# Patient Record
Sex: Female | Born: 1975 | Race: White | Hispanic: Yes | Marital: Married | Smoking: Never smoker
Health system: Southern US, Community
[De-identification: ages and names within clinical notes are randomized; demographics above are authoritative.]

## PROBLEM LIST (undated history)

## (undated) DIAGNOSIS — N979 Female infertility, unspecified: Secondary | ICD-10-CM

## (undated) DIAGNOSIS — K219 Gastro-esophageal reflux disease without esophagitis: Secondary | ICD-10-CM

## (undated) HISTORY — DX: Female infertility, unspecified: N97.9

## (undated) HISTORY — PX: OTHER SURGICAL HISTORY: SHX169

## (undated) HISTORY — DX: Gastro-esophageal reflux disease without esophagitis: K21.9

---

## 2006-01-27 ENCOUNTER — Inpatient Hospital Stay (HOSPITAL_COMMUNITY): Admission: AD | Admit: 2006-01-27 | Discharge: 2006-01-27 | Payer: Self-pay | Admitting: Gynecology

## 2007-11-05 ENCOUNTER — Ambulatory Visit: Payer: Self-pay | Admitting: Internal Medicine

## 2012-12-15 DIAGNOSIS — Z3400 Encounter for supervision of normal first pregnancy, unspecified trimester: Secondary | ICD-10-CM | POA: Insufficient documentation

## 2012-12-15 DIAGNOSIS — O09519 Supervision of elderly primigravida, unspecified trimester: Secondary | ICD-10-CM | POA: Insufficient documentation

## 2012-12-15 DIAGNOSIS — Z331 Pregnant state, incidental: Secondary | ICD-10-CM | POA: Insufficient documentation

## 2013-07-26 ENCOUNTER — Inpatient Hospital Stay: Payer: Self-pay

## 2013-07-27 LAB — CBC WITH DIFFERENTIAL/PLATELET
BASOS ABS: 0 10*3/uL (ref 0.0–0.1)
BASOS PCT: 0.4 %
BASOS PCT: 0.6 %
Basophil #: 0.1 10*3/uL (ref 0.0–0.1)
EOS ABS: 0.1 10*3/uL (ref 0.0–0.7)
EOS PCT: 0.7 %
EOS PCT: 0.4 %
Eosinophil #: 0.1 10*3/uL (ref 0.0–0.7)
HCT: 36.3 % (ref 35.0–47.0)
HCT: 38.2 % (ref 35.0–47.0)
HGB: 12.6 g/dL (ref 12.0–16.0)
HGB: 13.1 g/dL (ref 12.0–16.0)
LYMPHS PCT: 21.6 %
LYMPHS PCT: 19 %
Lymphocyte #: 2.2 10*3/uL (ref 1.0–3.6)
Lymphocyte #: 2.4 10*3/uL (ref 1.0–3.6)
MCH: 31.6 pg (ref 26.0–34.0)
MCH: 30.6 pg (ref 26.0–34.0)
MCHC: 34.8 g/dL (ref 32.0–36.0)
MCHC: 34.2 g/dL (ref 32.0–36.0)
MCV: 91 fL (ref 80–100)
MCV: 90 fL (ref 80–100)
Monocyte #: 0.6 x10 3/mm (ref 0.2–0.9)
Monocyte #: 0.8 x10 3/mm (ref 0.2–0.9)
Monocyte %: 6.4 %
Monocyte %: 6.2 %
NEUTROS ABS: 7.1 10*3/uL — AB (ref 1.4–6.5)
Neutrophil #: 9.1 10*3/uL — ABNORMAL HIGH (ref 1.4–6.5)
Neutrophil %: 70.9 %
Neutrophil %: 73.8 %
PLATELETS: 122 10*3/uL — AB (ref 150–440)
Platelet: 136 10*3/uL — ABNORMAL LOW (ref 150–440)
RBC: 4 10*6/uL (ref 3.80–5.20)
RBC: 4.27 10*6/uL (ref 3.80–5.20)
RDW: 13.7 % (ref 11.5–14.5)
RDW: 13.3 % (ref 11.5–14.5)
WBC: 10.1 10*3/uL (ref 3.6–11.0)
WBC: 12.4 10*3/uL — ABNORMAL HIGH (ref 3.6–11.0)

## 2013-07-29 LAB — HEMATOCRIT: HCT: 28.8 % — ABNORMAL LOW (ref 35.0–47.0)

## 2015-03-16 ENCOUNTER — Encounter: Payer: Self-pay | Admitting: Urology

## 2015-03-16 ENCOUNTER — Ambulatory Visit: Payer: 59 | Admitting: Urology

## 2015-03-16 VITALS — BP 99/66 | HR 76 | Ht 64.0 in | Wt 133.5 lb

## 2015-03-16 LAB — MICROSCOPIC EXAMINATION: Bacteria, UA: NONE SEEN

## 2015-03-16 LAB — URINALYSIS, COMPLETE
Bilirubin, UA: NEGATIVE
GLUCOSE, UA: NEGATIVE
Ketones, UA: NEGATIVE
Nitrite, UA: NEGATIVE
PROTEIN UA: NEGATIVE
Specific Gravity, UA: 1.015 (ref 1.005–1.030)
Urobilinogen, Ur: 1 mg/dL (ref 0.2–1.0)
pH, UA: 7 (ref 5.0–7.5)

## 2015-04-07 ENCOUNTER — Ambulatory Visit: Payer: 59

## 2015-04-12 ENCOUNTER — Ambulatory Visit: Payer: 59

## 2015-05-03 ENCOUNTER — Encounter: Payer: Self-pay | Admitting: Urology

## 2015-05-03 ENCOUNTER — Ambulatory Visit (INDEPENDENT_AMBULATORY_CARE_PROVIDER_SITE_OTHER): Payer: 59 | Admitting: Urology

## 2015-05-03 VITALS — BP 100/60 | HR 74 | Ht 64.0 in | Wt 136.3 lb

## 2015-05-03 DIAGNOSIS — R351 Nocturia: Secondary | ICD-10-CM | POA: Diagnosis not present

## 2015-05-03 DIAGNOSIS — R1084 Generalized abdominal pain: Secondary | ICD-10-CM

## 2015-05-03 DIAGNOSIS — R3129 Other microscopic hematuria: Secondary | ICD-10-CM | POA: Diagnosis not present

## 2015-05-03 DIAGNOSIS — R109 Unspecified abdominal pain: Secondary | ICD-10-CM | POA: Insufficient documentation

## 2015-05-03 LAB — URINALYSIS, COMPLETE
Bilirubin, UA: NEGATIVE
GLUCOSE, UA: NEGATIVE
Ketones, UA: NEGATIVE
LEUKOCYTES UA: NEGATIVE
Nitrite, UA: NEGATIVE
PH UA: 7 (ref 5.0–7.5)
Protein, UA: NEGATIVE
Specific Gravity, UA: 1.015 (ref 1.005–1.030)
Urobilinogen, Ur: 0.2 mg/dL (ref 0.2–1.0)

## 2015-05-03 LAB — MICROSCOPIC EXAMINATION
Epithelial Cells (non renal): NONE SEEN /hpf (ref 0–10)
RENAL EPITHEL UA: NONE SEEN /HPF
WBC, UA: NONE SEEN /hpf (ref 0–?)

## 2015-05-03 MED ORDER — LIDOCAINE HCL 2 % EX GEL
1.0000 "application " | Freq: Once | CUTANEOUS | Status: AC
  Administered 2015-05-03: 1 via URETHRAL

## 2015-05-03 MED ORDER — CIPROFLOXACIN HCL 500 MG PO TABS
500.0000 mg | ORAL_TABLET | Freq: Once | ORAL | Status: AC
  Administered 2015-05-03: 500 mg via ORAL

## 2015-05-03 NOTE — Progress Notes (Signed)
05/03/2015 9:32 AM   Vanessa Cox December 26, 1975 [id number]  Referring provider: Danella PentonMark F Miller, MD [phone] [facility] [place], [place] [id number]  Chief Complaint  Patient presents with  . Hematuria    microscopic hematuria, some sensitivity in the lower abdomen that comes and goes     HPI: The patient is a 39 year old woman who's had microscopic hematuria on 2 or 3 occasions. She's had no gross hematuria. She denies a history kidney stones or previous GU surgery. She's never smoked. She does not take daily aspirin or blood thinners. He gets up once or twice at night and voids every 90 minutes to 2 hours  There is no other modifying factors are associated signs or symptoms. There is no aggravating or relieving factors. The presentation is milder in severity and recurrent.  She's not had a hysterectomy    PMH: Past Medical History  Diagnosis Date  . GERD (gastroesophageal reflux disease)     Surgical History: Past Surgical History  Procedure Laterality Date  . None      Home Medications:    Medication List    Notice  As of 05/03/2015  9:32 AM   You have not been prescribed any medications.      Allergies: No Known Allergies  Family History: Family History  Problem Relation Age of Onset  . Bladder Cancer Neg Hx   . Kidney cancer Neg Hx   . Prostate cancer Neg Hx   . Urinary tract infection Mother   . Nephrolithiasis Brother     Social History:  reports that she has never smoked. She does not have any smokeless tobacco history on file. She reports that she does not drink alcohol or use illicit drugs.  ROS: UROLOGY Frequent Urination?: No Hard to postpone urination?: No Burning/pain with urination?: No Get up at night to urinate?: Yes Leakage of urine?: Yes Urine stream starts and stops?: No Trouble starting stream?: No Do you have to strain to urinate?: No Blood in urine?: Yes Urinary tract infection?: No Sexually  transmitted disease?: No Injury to kidneys or bladder?: No Painful intercourse?: No Weak stream?: No Currently pregnant?: No Vaginal bleeding?: No Last menstrual period?: 2wks ago  Gastrointestinal Nausea?: No Vomiting?: No Indigestion/heartburn?: No Diarrhea?: No Constipation?: No  Constitutional Fever: No Night sweats?: No Weight loss?: No Fatigue?: No  Skin Skin rash/lesions?: No Itching?: No  Eyes Blurred vision?: No Double vision?: No  Ears/Nose/Throat Sore throat?: No Sinus problems?: No  Hematologic/Lymphatic Swollen glands?: No Easy bruising?: No  Cardiovascular Leg swelling?: No Chest pain?: No  Respiratory Cough?: No Shortness of breath?: No  Endocrine Excessive thirst?: Yes  Musculoskeletal Back pain?: Yes Joint pain?: No  Neurological Headaches?: No Dizziness?: No  Psychologic Depression?: No Anxiety?: No  Physical Exam: BP 100/60 mmHg  Pulse 74  Ht 5\' 4"  (1.626 m)  Wt 61.825 kg (136 lb 4.8 oz)  BMI 23.38 kg/m2  Constitutional:  Alert and oriented, No acute distress. HEENT: [place] AT, moist mucus membranes.  Trachea midline, no masses. Cardiovascular: No clubbing, cyanosis, or edema. Respiratory: Normal respiratory effort, no increased work of breathing. GI: Abdomen is soft, nontender, nondistended, no abdominal masses GU: No CVA tenderness. NO prolapse Skin: No rashes, bruises or suspicious lesions. Lymph: No cervical or inguinal adenopathy. Neurologic: Grossly intact, no focal deficits, moving all 4 extremities. Psychiatric: Normal mood and affect.  Laboratory Data: Lab Results  Component Value Date   WBC 12.4* 07/27/2013   HGB 13.1 07/27/2013  HCT 28.8* 07/29/2013   MCV 90 07/27/2013   PLT 122* 07/27/2013    Cystoscopy: Verbal consent given. Sterile cystoscopy performed. Bladder mucosa and trigone were normal. There is no cystitis or foreign body or carcinoma. Urethra was normal. She tolerated the procedure very  well   Urinalysis    Component Value Date/Time   GLUCOSEU Negative 03/16/2015 0930   BILIRUBINUR Negative 03/16/2015 0930   NITRITE Negative 03/16/2015 0930   LEUKOCYTESUR 1+* 03/16/2015 0930    Pertinent Imaging: none  Assessment & Plan:  Assessment & Plan: The patient is a 39 year old woman with microscopic hematuria. Workup described. She'll return with a CT scan. The patient will follow up with a CT scan. If the CT scan is normal diagnoses will be benign microscopic hematuria  I'll send a copy of this note to Dr. Bethann Punches   1. Microscopic hematuria 2. Abdominal pain - Urinalysis, Complete   No Follow-up on file.  Vanessa Sinner, MD  [facility] [address] [place], [place] [address] [phone]

## 2015-05-19 ENCOUNTER — Ambulatory Visit: Payer: 59

## 2017-09-30 ENCOUNTER — Encounter: Payer: Self-pay | Admitting: Obstetrics & Gynecology

## 2017-10-01 ENCOUNTER — Ambulatory Visit (INDEPENDENT_AMBULATORY_CARE_PROVIDER_SITE_OTHER): Payer: 59 | Admitting: Obstetrics & Gynecology

## 2017-10-01 ENCOUNTER — Encounter: Payer: Self-pay | Admitting: Obstetrics & Gynecology

## 2017-10-01 VITALS — BP 100/60 | HR 80 | Ht 64.0 in | Wt 144.0 lb

## 2017-10-01 DIAGNOSIS — N393 Stress incontinence (female) (male): Secondary | ICD-10-CM | POA: Diagnosis not present

## 2017-10-01 DIAGNOSIS — Z0000 Encounter for general adult medical examination without abnormal findings: Secondary | ICD-10-CM | POA: Diagnosis not present

## 2017-10-01 DIAGNOSIS — Z1231 Encounter for screening mammogram for malignant neoplasm of breast: Secondary | ICD-10-CM | POA: Diagnosis not present

## 2017-10-01 DIAGNOSIS — Z1239 Encounter for other screening for malignant neoplasm of breast: Secondary | ICD-10-CM

## 2017-10-01 DIAGNOSIS — Z124 Encounter for screening for malignant neoplasm of cervix: Secondary | ICD-10-CM | POA: Diagnosis not present

## 2017-10-01 NOTE — Progress Notes (Signed)
HPI:      [name] is a 42 y.o. G1P1001 who LMP was Patient's last menstrual period was 09/14/2017., she presents today for her annual examination. The patient has no complaints today. The patient is sexually active. Her last pap: approximate date 2015 and was normal and nor prior MMG. The patient does perform self breast exams.  There is no notable family history of breast or ovarian cancer in her family.  The patient has regular exercise: yes.  The patient denies current symptoms of depression.  No BC as has had to have IUI to get pregnant in past and has not conceived off of BC since. Does not want.  Occas GSI w cough/sneeze, mild.  Reg cycles.  GYN History: Contraception: none  PMHx: Past Medical History:  Diagnosis Date  . GERD (gastroesophageal reflux disease)   . Infertility, female    Past Surgical History:  Procedure Laterality Date  . none     Family History  Problem Relation Age of Onset  . Urinary tract infection Mother   . Nephrolithiasis Brother   . Bladder Cancer Neg Hx   . Kidney cancer Neg Hx   . Prostate cancer Neg Hx    Social History   Tobacco Use  . Smoking status: Never Smoker  . Smokeless tobacco: Never Used  Substance Use Topics  . Alcohol use: No    Alcohol/week: 0.0 oz  . Drug use: No   No current outpatient medications on file. Allergies: Patient has no known allergies.  Review of Systems  Constitutional: Negative for chills, fever and malaise/fatigue.  HENT: Negative for congestion, sinus pain and sore throat.   Eyes: Negative for blurred vision and pain.  Respiratory: Negative for cough and wheezing.   Cardiovascular: Negative for chest pain and leg swelling.  Gastrointestinal: Negative for abdominal pain, constipation, diarrhea, heartburn, nausea and vomiting.  Genitourinary: Negative for dysuria, frequency, hematuria and urgency.  Musculoskeletal: Negative for back pain, joint pain, myalgias and neck pain.  Skin: Negative  for itching and rash.  Neurological: Negative for dizziness, tremors and weakness.  Endo/Heme/Allergies: Does not bruise/bleed easily.  Psychiatric/Behavioral: Negative for depression. The patient is not nervous/anxious and does not have insomnia.    Objective: BP 100/60   Pulse 80   Ht 5\' 4"  (1.626 m)   Wt 144 lb (65.3 kg)   LMP 09/14/2017   BMI 24.72 kg/m   Filed Weights   10/01/17 0808  Weight: 144 lb (65.3 kg)   Body mass index is 24.72 kg/m. Physical Exam  Constitutional: She is oriented to person, place, and time. She appears well-developed and well-nourished. No distress.  Genitourinary: Rectum normal, vagina normal and uterus normal. Pelvic exam was performed with patient supine. There is no rash or lesion on the right labia. There is no rash or lesion on the left labia. Vagina exhibits no lesion. No bleeding in the vagina. Right adnexum does not display mass and does not display tenderness. Left adnexum does not display mass and does not display tenderness. Cervix does not exhibit motion tenderness, lesion, friability or polyp.   Uterus is mobile and midaxial. Uterus is not enlarged or exhibiting a mass.  HENT:  Head: Normocephalic and atraumatic. Head is without laceration.  Right Ear: Hearing normal.  Left Ear: Hearing normal.  Nose: No epistaxis.  No foreign bodies.  Mouth/Throat: Uvula is midline, oropharynx is clear and moist and mucous membranes are normal.  Eyes: Pupils are equal, round, and reactive to light.  Neck: Normal range of motion. Neck supple. No thyromegaly present.  Cardiovascular: Normal rate and regular rhythm. Exam reveals no gallop and no friction rub.  No murmur heard. Pulmonary/Chest: Effort normal and breath sounds normal. No respiratory distress. She has no wheezes. Right breast exhibits no mass, no skin change and no tenderness. Left breast exhibits no mass, no skin change and no tenderness.  Abdominal: Soft. Bowel sounds are normal. She exhibits  no distension. There is no tenderness. There is no rebound.  Musculoskeletal: Normal range of motion.  Neurological: She is alert and oriented to person, place, and time. No cranial nerve deficit.  Skin: Skin is warm and dry.  Psychiatric: She has a normal mood and affect. Judgment normal.  Vitals reviewed.  Assessment:  ANNUAL EXAM 1. Annual physical exam   2. Screening for breast cancer   3. Screening for cervical cancer   4. Stress incontinence in female    Screening Plan:            1.  Cervical Screening-  Pap smear done today  2. Breast screening- Exam annually and mammogram>40 planned   3. Colonoscopy every 10 years, Hemoccult testing - after age 750  4. Labs managed by PCP  5. Counseling for contraception: no method .  Counseled still should consider as past h/o infertility does not guarantee future prevention  6. GSI, Kegels counseled.  Offered PT as future next option.    F/U  Return in about 1 year (around 10/02/2018) for Annual.  Vanessa MajorPaul Youa Deloney, MD, Merlinda FrederickFACOG [facility], [facility] 10/01/2017  9:35 AM

## 2017-10-01 NOTE — Patient Instructions (Signed)

## 2017-10-03 LAB — IGP, APTIMA HPV
HPV APTIMA: NEGATIVE
PAP Smear Comment: 0

## 2017-10-28 ENCOUNTER — Ambulatory Visit
Admission: RE | Admit: 2017-10-28 | Discharge: 2017-10-28 | Disposition: A | Discharge status: Home / Self Care | Payer: 59 | Source: Ambulatory Visit | Attending: Obstetrics & Gynecology | Admitting: Obstetrics & Gynecology

## 2017-10-28 ENCOUNTER — Other Ambulatory Visit: Payer: Self-pay | Admitting: Obstetrics & Gynecology

## 2017-10-28 DIAGNOSIS — Z1231 Encounter for screening mammogram for malignant neoplasm of breast: Secondary | ICD-10-CM | POA: Diagnosis present

## 2017-10-28 DIAGNOSIS — R928 Other abnormal and inconclusive findings on diagnostic imaging of breast: Secondary | ICD-10-CM | POA: Diagnosis not present

## 2017-10-28 DIAGNOSIS — N6489 Other specified disorders of breast: Secondary | ICD-10-CM

## 2017-10-28 DIAGNOSIS — Z1239 Encounter for other screening for malignant neoplasm of breast: Secondary | ICD-10-CM

## 2017-10-29 NOTE — Progress Notes (Signed)
D/w pt; to call and sch dx mmg/us reassured

## 2017-11-03 ENCOUNTER — Ambulatory Visit
Admission: RE | Admit: 2017-11-03 | Discharge: 2017-11-03 | Disposition: A | Discharge status: Home / Self Care | Payer: 59 | Source: Ambulatory Visit | Attending: Obstetrics & Gynecology | Admitting: Obstetrics & Gynecology

## 2017-11-03 DIAGNOSIS — N6489 Other specified disorders of breast: Secondary | ICD-10-CM

## 2017-11-03 DIAGNOSIS — R928 Other abnormal and inconclusive findings on diagnostic imaging of breast: Secondary | ICD-10-CM

## 2018-09-08 ENCOUNTER — Telehealth: Payer: Self-pay

## 2018-09-08 NOTE — Telephone Encounter (Signed)
Pt called triage stating she had a mammo done in April 2019 she is getting a billed for this. She thought a mammo was preventive care. The breast center told her that Dr Tiburcio Pea needs to changed the way he coded this order so she would not get billed. Can you change the dx code?

## 2018-09-08 NOTE — Telephone Encounter (Signed)
I contacted the patient to discuss info below. Patient thought everything she had should have been screening. We discussed that the 10/28/17 appointment was for a screening mammogram and should have been covered as preventative, and that the 11/03/17 appointment was for a diagnostic mammogram due to additional views for LT breast asymmetry, which insurance would have applied toward her deductible and coinsurance. Patient did not have her statement with her, and will call back tomorrow.

## 2018-09-08 NOTE — Telephone Encounter (Signed)
Please discuss with her.  It was coded for Screening Mammogram (preventative code) last year.  I did it correctly on this end.  She had a second follow up mammogram 6 days after the first, due to an abnormality seen on the first screening mammogram.  This was recommended by Radiology at [facility], and required a diagnostic code (no longer screening as it was a second follow up procedure).  We cannot change that code.

## 2019-11-25 ENCOUNTER — Ambulatory Visit (INDEPENDENT_AMBULATORY_CARE_PROVIDER_SITE_OTHER): Payer: 59 | Admitting: Obstetrics & Gynecology

## 2019-11-25 ENCOUNTER — Encounter: Payer: Self-pay | Admitting: Obstetrics & Gynecology

## 2019-11-25 ENCOUNTER — Other Ambulatory Visit: Payer: Self-pay

## 2019-11-25 VITALS — BP 120/70 | Ht 64.0 in | Wt 146.0 lb

## 2019-11-25 DIAGNOSIS — Z1231 Encounter for screening mammogram for malignant neoplasm of breast: Secondary | ICD-10-CM | POA: Diagnosis not present

## 2019-11-25 DIAGNOSIS — N9419 Other specified dyspareunia: Secondary | ICD-10-CM | POA: Diagnosis not present

## 2019-11-25 DIAGNOSIS — Z01419 Encounter for gynecological examination (general) (routine) without abnormal findings: Secondary | ICD-10-CM | POA: Diagnosis not present

## 2019-11-25 NOTE — Progress Notes (Signed)
HPI:      Vanessa Cox is a 44 y.o. G1P1001 who LMP was Patient's last menstrual period was 11/12/2019., she presents today for her annual examination. The patient has no complaints today, other than recent pain w intercourse and 6 mos h/o post coital bleeding esp close to time of cycle. The patient is sexually active. Her last pap: approximate date 2019 and was normal and last mammogram: approximate date 2019 and was normal. The patient does perform self breast exams.  There is no notable family history of breast or ovarian cancer in her family.  The patient has regular exercise: yes.  The patient denies current symptoms of depression.    GYN History: Contraception: none  PMHx: Past Medical History:  Diagnosis Date  . GERD (gastroesophageal reflux disease)   . Infertility, female    Past Surgical History:  Procedure Laterality Date  . none     Family History  Problem Relation Age of Onset  . Urinary tract infection Mother   . Nephrolithiasis Brother   . Bladder Cancer Neg Hx   . Kidney cancer Neg Hx   . Prostate cancer Neg Hx   . Breast cancer Neg Hx    Social History   Tobacco Use  . Smoking status: Never Smoker  . Smokeless tobacco: Never Used  Substance Use Topics  . Alcohol use: No    Alcohol/week: 0.0 standard drinks  . Drug use: No   No current outpatient medications on file. Allergies: Patient has no known allergies.  Review of Systems  Constitutional: Negative for chills, fever and malaise/fatigue.  HENT: Negative for congestion, sinus pain and sore throat.   Eyes: Negative for blurred vision and pain.  Respiratory: Negative for cough and wheezing.   Cardiovascular: Negative for chest pain and leg swelling.  Gastrointestinal: Negative for abdominal pain, constipation, diarrhea, heartburn, nausea and vomiting.  Genitourinary: Positive for frequency. Negative for dysuria, hematuria and urgency.  Musculoskeletal: Positive for joint pain. Negative for  back pain, myalgias and neck pain.  Skin: Negative for itching and rash.  Neurological: Negative for dizziness, tremors and weakness.  Endo/Heme/Allergies: Does not bruise/bleed easily.  Psychiatric/Behavioral: Negative for depression. The patient is not nervous/anxious and does not have insomnia.     Objective: BP 120/70   Ht 5\' 4"  (1.626 m)   Wt 146 lb (66.2 kg)   LMP 11/12/2019   BMI 25.06 kg/m   Filed Weights   11/25/19 1430  Weight: 146 lb (66.2 kg)   Body mass index is 25.06 kg/m. Physical Exam Constitutional:      General: She is not in acute distress.    Appearance: She is well-developed.  Genitourinary:     Pelvic exam was performed with patient supine.     Vagina, uterus and rectum normal.     No lesions in the vagina.     No vaginal bleeding.     No cervical motion tenderness, friability, lesion or polyp.     Uterus is mobile.     Uterus is not enlarged.     No uterine mass detected.    Uterus is midaxial.     No right or left adnexal mass present.     Right adnexa not tender.     Left adnexa not tender.  HENT:     Head: Normocephalic and atraumatic. No laceration.     Right Ear: Hearing normal.     Left Ear: Hearing normal.     Mouth/Throat:  Pharynx: Uvula midline.  Eyes:     Pupils: Pupils are equal, round, and reactive to light.  Neck:     Thyroid: No thyromegaly.  Cardiovascular:     Rate and Rhythm: Normal rate and regular rhythm.     Heart sounds: No murmur. No friction rub. No gallop.   Pulmonary:     Effort: Pulmonary effort is normal. No respiratory distress.     Breath sounds: Normal breath sounds. No wheezing.  Chest:     Breasts:        Right: No mass, skin change or tenderness.        Left: No mass, skin change or tenderness.  Abdominal:     General: Bowel sounds are normal. There is no distension.     Palpations: Abdomen is soft.     Tenderness: There is no abdominal tenderness. There is no rebound.  Musculoskeletal:         General: Normal range of motion.     Cervical back: Normal range of motion and neck supple.  Neurological:     Mental Status: She is alert and oriented to person, place, and time.     Cranial Nerves: No cranial nerve deficit.  Skin:    General: Skin is warm and dry.  Psychiatric:        Judgment: Judgment normal.  Vitals reviewed.     Assessment:  ANNUAL EXAM 1. Women's annual routine gynecological examination   2. Encounter for screening mammogram for malignant neoplasm of breast   3. Dyspareunia due to medical condition in female      Screening Plan:            1.  Cervical Screening-  Pap smear not done today, due 2022  2. Breast screening- Exam annually and mammogram>40 planned   3. Colonoscopy every 10 years, Hemoccult testing - after age 66  4. Labs managed by PCP  5. Counseling for contraception: no method   6. Dyspareunia and post coital bleeding due to medical condition in female - Normal exam, monitor for pattern of sx's - US PELVIC COMPLETE WITH TRANSVAGINAL to assess anatomy and for any etiology for these sx's   7. Kegels advised for minor freq/urgency noted     F/U  Return in about 1 year (around 11/24/2020) for Annual.  Barnett Applebaum, MD, [facility], [facility] 11/25/2019  2:57 PM

## 2019-11-25 NOTE — Patient Instructions (Signed)
PAP every three years Mammogram every year    Call [phone] to schedule at [facility] yearly (with PCP)    Kegel Exercises  Kegel exercises can help strengthen your pelvic floor muscles. The pelvic floor is a group of muscles that support your rectum, small intestine, and bladder. In females, pelvic floor muscles also help support the womb (uterus). These muscles help you control the flow of urine and stool. Kegel exercises are painless and simple, and they do not require any equipment. Your provider may suggest Kegel exercises to:  Improve bladder and bowel control.  Improve sexual response.  Improve weak pelvic floor muscles after surgery to remove the uterus (hysterectomy) or pregnancy (females).  Improve weak pelvic floor muscles after prostate gland removal or surgery (males). Kegel exercises involve squeezing your pelvic floor muscles, which are the same muscles you squeeze when you try to stop the flow of urine or keep from passing gas. The exercises can be done while sitting, standing, or lying down, but it is best to vary your position. Exercises How to do Kegel exercises: 1. Squeeze your pelvic floor muscles tight. You should feel a tight lift in your rectal area. If you are a female, you should also feel a tightness in your vaginal area. Keep your stomach, buttocks, and legs relaxed. 2. Hold the muscles tight for up to 10 seconds. 3. Breathe normally. 4. Relax your muscles. 5. Repeat as told by your health care provider. Repeat this exercise daily as told by your health care provider. Continue to do this exercise for at least 4-6 weeks, or for as long as told by your health care provider. You may be referred to a physical therapist who can help you learn more about how to do Kegel exercises. Depending on your condition, your health care provider may recommend:  Varying how long you squeeze your muscles.  Doing several sets of exercises every day.  Doing exercises  for several weeks.  Making Kegel exercises a part of your regular exercise routine. This information is not intended to replace advice given to you by your health care provider. Make sure you discuss any questions you have with your health care provider. Document Revised: 02/25/2018 Document Reviewed: 02/25/2018 Elsevier Patient Education  2020 [employer].

## 2019-12-13 ENCOUNTER — Ambulatory Visit (INDEPENDENT_AMBULATORY_CARE_PROVIDER_SITE_OTHER): Payer: 59 | Admitting: Obstetrics & Gynecology

## 2019-12-13 ENCOUNTER — Other Ambulatory Visit: Payer: Self-pay

## 2019-12-13 ENCOUNTER — Ambulatory Visit (INDEPENDENT_AMBULATORY_CARE_PROVIDER_SITE_OTHER): Payer: 59

## 2019-12-13 ENCOUNTER — Other Ambulatory Visit: Payer: Self-pay | Admitting: Obstetrics & Gynecology

## 2019-12-13 ENCOUNTER — Encounter: Payer: Self-pay | Admitting: Obstetrics & Gynecology

## 2019-12-13 VITALS — BP 120/80 | Ht 64.0 in | Wt 147.0 lb

## 2019-12-13 DIAGNOSIS — N9419 Other specified dyspareunia: Secondary | ICD-10-CM | POA: Diagnosis not present

## 2019-12-13 DIAGNOSIS — N930 Postcoital and contact bleeding: Secondary | ICD-10-CM | POA: Diagnosis not present

## 2019-12-13 NOTE — Progress Notes (Signed)
  HPI: Pt has been having post coital bleeding for a few months now, usually occurs w sex a few days prior to menses (does not happen other times of cycle).  No pain.  Not desiring hormonal contraception (has not been on in >20 yrs; had insemination for pregnancy 14 yrs ago due to spouse's low sprem count and motility disorder).  Menopause for mother was age 44.    Ultrasound demonstrates no masses seen These findings are Pelvis normal  PMHx: She  has a past medical history of GERD (gastroesophageal reflux disease) and Infertility, female. Also,  has a past surgical history that includes none., family history includes Nephrolithiasis in her brother; Urinary tract infection in her mother.,  reports that she has never smoked. She has never used smokeless tobacco. She reports that she does not drink alcohol or use drugs.  She currently has no medications in their medication list. Also, has No Known Allergies.  ROS  Objective: BP 120/80   Ht 5\' 4"  (1.626 m)   Wt 147 lb (66.7 kg)   LMP 12/08/2019   BMI 25.23 kg/m   Physical examination Constitutional NAD, Conversant  Skin No rashes, lesions or ulceration.   Extremities: Moves all appropriately.  Normal ROM for age. No lymphadenopathy.  Neuro: Grossly intact  Psych: Oriented to PPT.  Normal mood. Normal affect.   12/10/2019 PELVIS TRANSVAGINAL NON-OB (TV ONLY)  Result Date: 12/13/2019 Patient Name: Vanessa Cox DOB: 09-20-1975 MRN: [id number] ULTRASOUND REPORT Location: [facility] Date of Service: 12/13/2019 Indications:Abnormal Uterine Bleeding Findings: The uterus is anteverted and measures 7.6 x 4.9 x 3.8 cm. Echo texture is homogenous without evidence of focal masses. The Endometrium measures 9.9 mm. Right Ovary measures 2.6 x 1.9 x 1.9 cm. It is normal in appearance. Left Ovary measures 3.0 x 2.0 x 1.6 cm. It is normal in appearance. Survey of the adnexa demonstrates no adnexal masses. There is no free fluid in the cul de sac.  Impression: 1. Normal pelvic ultrasound. Recommendations: 1.Clinical correlation with the patient's History and Physical Exam. 12/15/2019, RT Review of ULTRASOUND.    I have personally reviewed images and report of recent ultrasound done at [facility].    Plan of management to be discussed with patient. [facility], MD, FACOG [facility], [facility] 12/13/2019  1:23 PM   Assessment:  PCB (post coital bleeding)  Hormones most likely option to help, although no physical concerns found on exam or 12/15/2019, and no reason to treat unless bothersome to pt.  Pt does not want birth control meds or hormones, and likelihood for pregnancy is there but low due to husband condition.  As long as no worsening bleeding then no intervention necessary.  A total of 20 minutes were spent face-to-face with the patient as well as preparation, review, communication, and documentation during this encounter.   [place], MD, [facility], [facility] 12/13/2019  1:35 PM

## 2019-12-29 ENCOUNTER — Ambulatory Visit
Admission: RE | Admit: 2019-12-29 | Discharge: 2019-12-29 | Disposition: A | Discharge status: Home / Self Care | Payer: 59 | Source: Ambulatory Visit | Attending: Obstetrics & Gynecology | Admitting: Obstetrics & Gynecology

## 2019-12-29 DIAGNOSIS — Z1231 Encounter for screening mammogram for malignant neoplasm of breast: Secondary | ICD-10-CM | POA: Insufficient documentation

## 2019-12-30 ENCOUNTER — Encounter: Payer: Self-pay | Admitting: Obstetrics & Gynecology

## 2020-12-07 ENCOUNTER — Other Ambulatory Visit: Payer: Self-pay | Admitting: Obstetrics & Gynecology

## 2020-12-07 DIAGNOSIS — Z1231 Encounter for screening mammogram for malignant neoplasm of breast: Secondary | ICD-10-CM

## 2021-01-02 ENCOUNTER — Ambulatory Visit
Admission: RE | Admit: 2021-01-02 | Discharge: 2021-01-02 | Disposition: A | Discharge status: Home / Self Care | Payer: 59 | Source: Ambulatory Visit | Attending: Obstetrics & Gynecology | Admitting: Obstetrics & Gynecology

## 2021-01-02 ENCOUNTER — Other Ambulatory Visit: Payer: Self-pay

## 2021-01-02 DIAGNOSIS — Z1231 Encounter for screening mammogram for malignant neoplasm of breast: Secondary | ICD-10-CM | POA: Insufficient documentation

## 2021-01-03 ENCOUNTER — Other Ambulatory Visit: Payer: Self-pay | Admitting: Obstetrics & Gynecology

## 2021-01-16 ENCOUNTER — Other Ambulatory Visit (HOSPITAL_COMMUNITY)
Admission: RE | Admit: 2021-01-16 | Discharge: 2021-01-16 | Disposition: A | Discharge status: Home / Self Care | Payer: 59 | Source: Ambulatory Visit | Attending: Obstetrics & Gynecology | Admitting: Obstetrics & Gynecology

## 2021-01-16 ENCOUNTER — Encounter: Payer: Self-pay | Admitting: Obstetrics & Gynecology

## 2021-01-16 ENCOUNTER — Other Ambulatory Visit: Payer: Self-pay

## 2021-01-16 ENCOUNTER — Ambulatory Visit (INDEPENDENT_AMBULATORY_CARE_PROVIDER_SITE_OTHER): Payer: 59 | Admitting: Obstetrics & Gynecology

## 2021-01-16 VITALS — BP 100/70 | Ht 64.0 in | Wt 145.0 lb

## 2021-01-16 DIAGNOSIS — Z124 Encounter for screening for malignant neoplasm of cervix: Secondary | ICD-10-CM | POA: Insufficient documentation

## 2021-01-16 DIAGNOSIS — N921 Excessive and frequent menstruation with irregular cycle: Secondary | ICD-10-CM | POA: Diagnosis not present

## 2021-01-16 DIAGNOSIS — Z1231 Encounter for screening mammogram for malignant neoplasm of breast: Secondary | ICD-10-CM | POA: Diagnosis not present

## 2021-01-16 DIAGNOSIS — Z01419 Encounter for gynecological examination (general) (routine) without abnormal findings: Secondary | ICD-10-CM

## 2021-01-16 NOTE — Progress Notes (Signed)
HPI:      Ms. Tangia Pinard is a 45 y.o. G1P1001 who LMP was Patient's last menstrual period was 12/27/2020 (approximate)., she presents today for her annual examination. The patient has no complaints today other than last w mos she has had extra cycle like bleeding in between periods, also cont w postcoital bleeding.  She had pelvic US last year that was normal. The patient is sexually active. Her last pap: approximate date 2019 and was normal and last mammogram: approximate date 2022 and was normal. The patient does perform self breast exams.  There is no notable family history of breast or ovarian cancer in her family.  The patient has regular exercise: yes.  The patient denies current symptoms of depression.    GYN History: Contraception: vasectomy  PMHx: Past Medical History:  Diagnosis Date   GERD (gastroesophageal reflux disease)    Infertility, female    Past Surgical History:  Procedure Laterality Date   none     Family History  Problem Relation Age of Onset   Urinary tract infection Mother    Nephrolithiasis Brother    Bladder Cancer Neg Hx    Kidney cancer Neg Hx    Prostate cancer Neg Hx    Breast cancer Neg Hx    Social History   Tobacco Use   Smoking status: Never   Smokeless tobacco: Never  Vaping Use   Vaping Use: Never used  Substance Use Topics   Alcohol use: No    Alcohol/week: 0.0 standard drinks   Drug use: No   No current outpatient medications on file. Allergies: Patient has no known allergies.  Review of Systems  Constitutional:  Negative for chills, fever and malaise/fatigue.  HENT:  Negative for congestion, sinus pain and sore throat.   Eyes:  Negative for blurred vision and pain.  Respiratory:  Negative for cough and wheezing.   Cardiovascular:  Negative for chest pain and leg swelling.  Gastrointestinal:  Negative for abdominal pain, constipation, diarrhea, heartburn, nausea and vomiting.  Genitourinary:  Negative for dysuria,  frequency, hematuria and urgency.  Musculoskeletal:  Negative for back pain, joint pain, myalgias and neck pain.  Skin:  Negative for itching and rash.  Neurological:  Negative for dizziness, tremors and weakness.  Endo/Heme/Allergies:  Does not bruise/bleed easily.  Psychiatric/Behavioral:  Negative for depression. The patient is not nervous/anxious and does not have insomnia.    Objective: BP 100/70   Ht 5\' 4"  (1.626 m)   Wt 145 lb (65.8 kg)   LMP 12/27/2020 (Approximate)   BMI 24.89 kg/m   Filed Weights   01/16/21 0803  Weight: 145 lb (65.8 kg)   Body mass index is 24.89 kg/m. Physical Exam Constitutional:      General: She is not in acute distress.    Appearance: She is well-developed.  Genitourinary:     Bladder, rectum and urethral meatus normal.     No lesions in the vagina.     Right Labia: No rash, tenderness or lesions.    Left Labia: No tenderness, lesions or rash.    No vaginal bleeding.      Right Adnexa: not tender and no mass present.    Left Adnexa: not tender and no mass present.    No cervical motion tenderness, friability, lesion or polyp.     Uterus is not enlarged.     No uterine mass detected.    Pelvic exam was performed with patient in the lithotomy position.  Breasts:  Right: No mass, skin change or tenderness.     Left: No mass, skin change or tenderness.  HENT:     Head: Normocephalic and atraumatic. No laceration.     Right Ear: Hearing normal.     Left Ear: Hearing normal.     Mouth/Throat:     Pharynx: Uvula midline.  Eyes:     Pupils: Pupils are equal, round, and reactive to light.  Neck:     Thyroid: No thyromegaly.  Cardiovascular:     Rate and Rhythm: Normal rate and regular rhythm.     Heart sounds: No murmur heard.   No friction rub. No gallop.  Pulmonary:     Effort: Pulmonary effort is normal. No respiratory distress.     Breath sounds: Normal breath sounds. No wheezing.  Abdominal:     General: Bowel sounds are normal.  There is no distension.     Palpations: Abdomen is soft.     Tenderness: There is no abdominal tenderness. There is no rebound.  Musculoskeletal:        General: Normal range of motion.     Cervical back: Normal range of motion and neck supple.  Neurological:     Mental Status: She is alert and oriented to person, place, and time.     Cranial Nerves: No cranial nerve deficit.  Skin:    General: Skin is warm and dry.  Psychiatric:        Judgment: Judgment normal.  Vitals reviewed.    Assessment:  ANNUAL EXAM 1. Women's annual routine gynecological examination   2. Encounter for screening mammogram for malignant neoplasm of breast   3. Screening for cervical cancer   4. Metrorrhagia      Screening Plan:            1.  Cervical Screening-  Pap smear done today  2. Breast screening- Exam annually and mammogram>40 planned   3. Colonoscopy every 10 years, Hemoccult testing - after age 11  4. Labs managed by PCP  5. Counseling for contraception: vasectomy  6. Metrorrhagia: [place] to assess for pathology. Options for Mirena, OCP, Ablation discussed, to consider if bleeding pattern persists and is bothersome. SIS vs hysteroscopy for endometrial thickening investigation, if present, as an option     F/U  Return in about 1 year (around 01/16/2022) for Annual.  Annamarie Major, MD, [facility], [facility] 01/16/2021  8:38 AM

## 2021-01-16 NOTE — Patient Instructions (Addendum)
PAP every three years Mammogram every year (done!) Labs yearly (with PCP)  Thank you for choosing [facility]. As part of our ongoing efforts to improve patient experience, we would appreciate your feedback. Please fill out the short survey that you will receive by mail or MyChart. Your opinion is important to [place]! - Dr. Tiburcio Pea  Endometrial Ablation Endometrial ablation is a procedure that destroys the thin inner layer of the lining of the uterus (endometrium). This procedure may be done: To stop heavy menstrual periods. To stop bleeding that is causing anemia. To control irregular bleeding. To treat bleeding caused by small tumors (fibroids) in the endometrium. This procedure is often done as an alternative to major surgery, such as removal of the uterus and cervix (hysterectomy). As a result of this procedure: You may not be able to have children. However, if you have not yet gone through menopause: You may still have a small chance of getting pregnant. You will need to use a reliable method of birth control after the procedure to prevent pregnancy. You may stop having a menstrual period, or you may have only a small amount of bleeding during your period. Menstruation may return several years after the procedure. Tell a health care provider about: Any allergies you have. All medicines you are taking, including vitamins, herbs, eye drops, creams, and over-the-counter medicines. Any problems you or family members have had with the use of anesthetic medicines. Any blood disorders you have. Any surgeries you have had. Any medical conditions you have. Whether you are pregnant or may be pregnant. What are the risks? Generally, this is a safe procedure. However, problems may occur, including: A hole (perforation) in the uterus or bowel. Infection in the uterus, bladder, or vagina. Bleeding. Allergic reaction to medicines. Damage to nearby structures or organs. An air bubble in the lung  (air embolus). Problems with pregnancy. Failure of the procedure. Decreased ability to diagnose cancer in the endometrium. Scar tissue forms after the procedure, making it more difficult to get a sample of the uterine lining. What happens before the procedure? Medicines Ask your health care provider about: Changing or stopping your regular medicines. This is especially important if you take diabetes medicines or blood thinners. Taking medicines such as aspirin and ibuprofen. These medicines can thin your blood. Do not take these medicines before your procedure if your doctor tells you not to take them. Taking over-the-counter medicines, vitamins, herbs, and supplements. Tests You will have tests of your endometrium to make sure there are no precancerous cells or cancer cells present. You may have an ultrasound of the uterus. General instructions Do not use any products that contain nicotine or tobacco for at least 4 weeks before the procedure. These include cigarettes, chewing tobacco, and vaping devices, such as e-cigarettes. If you need help quitting, ask your health care provider. You may be given medicines to thin the endometrium. Ask your health care provider what steps will be taken to help prevent infection. These steps may include: Removing hair at the surgery site. Washing skin with a germ-killing soap. Taking antibiotic medicine. Plan to have a responsible adult take you home from the hospital or clinic. Plan to have a responsible adult care for you for the time you are told after you leave the hospital or clinic. This is important. What happens during the procedure?  You will lie on an exam table with your feet and legs supported as in a pelvic exam. An IV will be inserted into one  of your veins. You will be given a medicine to help you relax (sedative). A surgical tool with a light and camera (resectoscope) will be inserted into your vagina and moved into your uterus. This  allows your surgeon to see inside your uterus. Endometrial tissue will be destroyed and removed, using one of the following methods: Radiofrequency. This uses an electrical current to destroy the endometrium. Cryotherapy. This uses extreme cold to freeze the endometrium. Heated fluid. This uses a heated salt and water (saline) solution to destroy the endometrium. Microwave. This uses high-energy microwaves to heat up the endometrium and destroy it. Thermal balloon. This involves inserting a catheter with a balloon tip into the uterus. The balloon tip is filled with heated fluid to destroy the endometrium. The procedure may vary among health care providers and hospitals. What happens after the procedure? Your blood pressure, heart rate, breathing rate, and blood oxygen level will be monitored until you leave the hospital or clinic. You may have vaginal bleeding for 4-6 weeks after the procedure. You may also have: Cramps. A thin, watery vaginal discharge that is light pink or brown. A need to urinate more than usual. Nausea. If you were given a sedative during the procedure, it can affect you for several hours. Do not drive or operate machinery until your health care provider says that it is safe. Do not have sex or insert anything into your vagina until your health care provider says it is safe. Summary Endometrial ablation is done to treat many causes of heavy menstrual bleeding. The procedure destroys the thin inner layer of the lining of the uterus (endometrium). This procedure is often done as an alternative to major surgery, such as removal of the uterus and cervix (hysterectomy). Plan to have a responsible adult take you home from the hospital or clinic. This information is not intended to replace advice given to you by your health care provider. Make sure you discuss any questions you have with your healthcare provider. Document Revised: 01/27/2020 Document Reviewed: 01/27/2020 Elsevier  Patient Education  2022 Elsevier Inc.  Levonorgestrel Intrauterine Device What is this medication? LEVONORGESTREL (LEE voe nor jes trel) prevents ovulation and pregnancy. It may also be used to treat heavy periods. It belongs to a group of medicationscalled contraceptives. This medication is a progestin hormone. This medicine may be used for other purposes; ask your health care provider orpharmacist if you have questions. COMMON BRAND NAME(S): Cameron AliKyleena, LILETTA, Mirena, Skyla What should I tell my care team before I take this medication? They need to know if you have any of these conditions: Abnormal Pap smear Cancer of the breast, uterus, or cervix Diabetes Endometritis Genital or pelvic infection now or in the past Have more than one sexual partner or your partner has more than one partner Heart disease History of an ectopic or tubal pregnancy Immune system problems IUD in place Liver disease or tumor Problems with blood clots or take blood-thinners Seizures Use intravenous drugs Uterus of unusual shape Vaginal bleeding that has not been explained An unusual or allergic reaction to levonorgestrel, other hormones, silicone, or polyethylene, medications, foods, dyes, or preservatives Pregnant or trying to get pregnant Breast-feeding How should I use this medication? This device is placed inside the uterus by your care team. A patient package insert for the product will be given each time it is inserted. Be sure to read this information carefully each time. The sheet maychange often. Talk to your care team about use of this medication in  children. Special caremay be needed. Overdosage: If you think you have taken too much of this medicine contact apoison control center or emergency room at once. NOTE: This medicine is only for you. Do not share this medicine with others. What if I miss a dose? This does not apply. Depending on the brand of device you have inserted, the device will  need to be replaced every 3 to 7 years if you wish to continueusing this type of birth control. What may interact with this medication? Interactions are not expected. Tell your care team about all the medicationsyou take. This list may not describe all possible interactions. Give your health care provider a list of all the medicines, herbs, non-prescription drugs, or dietary supplements you use. Also tell them if you smoke, drink alcohol, or use illegaldrugs. Some items may interact with your medicine. What should I watch for while using this medication? Visit your care team for regular check-ups. Tell your care team if you or yourpartner becomes HIV positive or gets a sexually transmitted disease. This product does not protect you against HIV infection (AIDS) or othersexually transmitted diseases. You can check the placement of the IUD yourself by reaching up to the top of your vagina with clean fingers to feel the threads. Do not pull on the threads. It is a good habit to check placement after each menstrual period. Call your care team right away if you feel more of the IUD than just the threads or ifyou cannot feel the threads at all. The IUD may come out by itself. You may become pregnant if the device comes out. If you notice that the IUD has come out use a backup birth control methodlike condoms and call your care team. Using tampons will not change the position of the IUD and are okay to useduring your period. This IUD can be safely scanned with magnetic resonance imaging (MRI) only under specific conditions. Before you have an MRI, tell your care team that you havean IUD in place, and which type of IUD you have in place. What side effects may I notice from receiving this medication? Side effects that you should report to your care team as soon as possible: Allergic reactions-skin rash, itching, hives, swelling of the face, lips, tongue, or throat Blood clot-pain, swelling, or warmth in the leg,  shortness of breath, chest pain Gallbladder problems-severe stomach pain, nausea, vomiting, fever Increase in blood pressure Liver injury-right upper belly pain, loss of appetite, nausea, light-colored stool, dark yellow or brown urine, yellowing skin or eyes, unusual weakness or fatigue New or worsening migraines or headaches Pelvic inflammatory disease (PID)-fever, abdominal pain, pelvic pain, pain or trouble passing urine, spotting, bleeding during or after sex Stroke-sudden numbness or weakness of the face, arm, or leg, trouble speaking, confusion, trouble walking, loss of balance or coordination, dizziness, severe headache, change in vision Unusual vaginal discharge, itching, or odor Vaginal pain, irritation, or sores Worsening mood, feelings of depression Side effects that usually do not require medical attention (report to your careteam if they continue or are bothersome): Breast pain or tenderness Dark patches of skin on the face or other sun-exposed areas Irregular menstrual cycles or spotting Nausea Weight gain This list may not describe all possible side effects. Call your doctor for medical advice about side effects. You may report side effects to FDA at1-800-FDA-1088. Where should I keep my medication? This does not apply. NOTE: This sheet is a summary. It may not cover all possible information. If you  have questions about this medicine, talk to your doctor, pharmacist, orhealth care provider.  2022 Elsevier/Gold Standard (2020-08-14 16:46:12)

## 2021-01-18 LAB — CYTOLOGY - PAP
Comment: NEGATIVE
Diagnosis: NEGATIVE
High risk HPV: NEGATIVE

## 2021-02-19 ENCOUNTER — Ambulatory Visit: Payer: 59

## 2021-02-20 ENCOUNTER — Encounter: Payer: Self-pay | Admitting: Obstetrics & Gynecology

## 2021-02-20 ENCOUNTER — Ambulatory Visit (INDEPENDENT_AMBULATORY_CARE_PROVIDER_SITE_OTHER): Payer: 59 | Admitting: Obstetrics & Gynecology

## 2021-02-20 ENCOUNTER — Other Ambulatory Visit: Payer: Self-pay

## 2021-02-20 DIAGNOSIS — N921 Excessive and frequent menstruation with irregular cycle: Secondary | ICD-10-CM | POA: Diagnosis not present

## 2021-02-20 NOTE — Progress Notes (Signed)
Virtual Visit via Telephone Note  I connected with Vanessa Cox on 02/20/21 at  9:40 AM EDT by telephone and verified that I am speaking with the correct person using two identifiers.  Location: Patient: Home Provider: Office   I discussed the limitations, risks, security and privacy concerns of performing an evaluation and management service by telephone and the availability of in person appointments. I also discussed with the patient that there may be a patient responsible charge related to this service. The patient expressed understanding and agreed to proceed.  History of Present Illness: Pt reports normal period this past month w no episode of BTB.  She reports she did not have US done bc of this.  She has h/o normal; [place] w 9 mm endometrium in past.  She does not desire hormone therpay at this time.  No other sx's.  No change in PMH/PSH/SH/FH/Meds.   Observations/Objective: No exam today, due to telephone eVisit due to [facility] virus restriction on elective visits and procedures.  Prior visits reviewed along with ultrasounds/labs as indicated.  Assessment and Plan: 1. Metrorrhagia Monitor for further pattern of BTB or excessive periods Options for Mirena, Ablation, other discussed  Follow Up Instructions: As needed   I discussed the assessment and treatment plan with the patient. The patient was provided an opportunity to ask questions and all were answered. The patient agreed with the plan and demonstrated an understanding of the instructions.   The patient was advised to call back or seek an in-person evaluation if the symptoms worsen or if the condition fails to improve as anticipated.  I provided 14 minutes of non-face-to-face time during this encounter.   Letitia Libra, MD

## 2021-03-13 ENCOUNTER — Other Ambulatory Visit: Payer: Self-pay | Admitting: Obstetrics & Gynecology

## 2021-03-13 DIAGNOSIS — N921 Excessive and frequent menstruation with irregular cycle: Secondary | ICD-10-CM

## 2021-05-11 ENCOUNTER — Other Ambulatory Visit: Payer: Self-pay

## 2021-05-11 DIAGNOSIS — S0181XA Laceration without foreign body of other part of head, initial encounter: Secondary | ICD-10-CM | POA: Diagnosis not present

## 2021-05-11 DIAGNOSIS — S0101XA Laceration without foreign body of scalp, initial encounter: Secondary | ICD-10-CM | POA: Diagnosis not present

## 2021-05-11 DIAGNOSIS — W01198A Fall on same level from slipping, tripping and stumbling with subsequent striking against other object, initial encounter: Secondary | ICD-10-CM | POA: Insufficient documentation

## 2021-05-11 DIAGNOSIS — R55 Syncope and collapse: Secondary | ICD-10-CM | POA: Diagnosis not present

## 2021-05-11 DIAGNOSIS — Y9301 Activity, walking, marching and hiking: Secondary | ICD-10-CM | POA: Insufficient documentation

## 2021-05-11 DIAGNOSIS — Z23 Encounter for immunization: Secondary | ICD-10-CM | POA: Insufficient documentation

## 2021-05-11 DIAGNOSIS — S0990XA Unspecified injury of head, initial encounter: Secondary | ICD-10-CM | POA: Diagnosis present

## 2021-05-11 LAB — CBC
HCT: 42.2 % (ref 36.0–46.0)
Hemoglobin: 14.7 g/dL (ref 12.0–15.0)
MCH: 30.3 pg (ref 26.0–34.0)
MCHC: 34.8 g/dL (ref 30.0–36.0)
MCV: 87 fL (ref 80.0–100.0)
Platelets: 216 10*3/uL (ref 150–400)
RBC: 4.85 MIL/uL (ref 3.87–5.11)
RDW: 13 % (ref 11.5–15.5)
WBC: 9.8 10*3/uL (ref 4.0–10.5)
nRBC: 0 % (ref 0.0–0.2)

## 2021-05-11 LAB — BASIC METABOLIC PANEL
Anion gap: 12 (ref 5–15)
BUN: 16 mg/dL (ref 6–20)
CO2: 24 mmol/L (ref 22–32)
Calcium: 9.1 mg/dL (ref 8.9–10.3)
Chloride: 103 mmol/L (ref 98–111)
Creatinine, Ser: 0.92 mg/dL (ref 0.44–1.00)
GFR, Estimated: >60 mL/min (ref >60)
Glucose, Bld: 118 mg/dL — ABNORMAL HIGH (ref 70–99)
Potassium: 4 mmol/L (ref 3.5–5.1)
Sodium: 139 mmol/L (ref 135–145)

## 2021-05-11 LAB — TROPONIN I (HIGH SENSITIVITY): Troponin I (High Sensitivity): <2 ng/L (ref <18)

## 2021-05-11 MED ORDER — ACETAMINOPHEN 500 MG PO TABS
1000.0000 mg | ORAL_TABLET | Freq: Once | ORAL | Status: AC
  Administered 2021-05-11: 1000 mg via ORAL
  Filled 2021-05-11: qty 2

## 2021-05-11 MED ORDER — SODIUM CHLORIDE 0.9 % IV BOLUS
1000.0000 mL | Freq: Once | INTRAVENOUS | Status: AC
  Administered 2021-05-11: 1000 mL via INTRAVENOUS

## 2021-05-11 NOTE — ED Triage Notes (Signed)
Pt presents to ER after losing consciousness, falling and hitting her head while walking to restroom.  Pt states she had a few drinks today before the incident.  Pt currently A&O x4.  Pt has appx 5 cm deep lac over left eye.

## 2021-05-12 ENCOUNTER — Emergency Department
Admission: EM | Admit: 2021-05-12 | Discharge: 2021-05-12 | Disposition: A | Discharge status: Home / Self Care | Payer: 59 | Attending: Emergency Medicine | Admitting: Emergency Medicine

## 2021-05-12 DIAGNOSIS — W19XXXA Unspecified fall, initial encounter: Secondary | ICD-10-CM

## 2021-05-12 DIAGNOSIS — R55 Syncope and collapse: Secondary | ICD-10-CM

## 2021-05-12 DIAGNOSIS — S0181XA Laceration without foreign body of other part of head, initial encounter: Secondary | ICD-10-CM

## 2021-05-12 LAB — HCG, QUANTITATIVE, PREGNANCY: hCG, Beta Chain, Quant, S: <1 m[IU]/mL (ref <5)

## 2021-05-12 MED ORDER — LIDOCAINE-EPINEPHRINE 2 %-1:100000 IJ SOLN
20.0000 mL | Freq: Once | INTRAMUSCULAR | Status: AC
  Administered 2021-05-12: 20 mL via INTRADERMAL
  Filled 2021-05-12: qty 1

## 2021-05-12 MED ORDER — TETANUS-DIPHTH-ACELL PERTUSSIS 5-2.5-18.5 LF-MCG/0.5 IM SUSY
0.5000 mL | PREFILLED_SYRINGE | Freq: Once | INTRAMUSCULAR | Status: AC
  Administered 2021-05-12: 0.5 mL via INTRAMUSCULAR
  Filled 2021-05-12: qty 0.5

## 2021-05-12 NOTE — ED Notes (Signed)
Pt refusing CT at this time

## 2021-05-12 NOTE — ED Provider Notes (Signed)
[facility] Emergency Department Provider Note  ____________________________________________   Event Date/Time   First MD Initiated Contact with Patient 05/12/21 0510     (approximate)  I have reviewed the triage vital signs and the nursing notes.   HISTORY  Chief Complaint Facial Laceration and Fall   HPI Vanessa Cox is a 45 y.o. female with a past medical history of GERD who presents coming by husband for evaluation after a fall that occurred earlier this evening.  Patient states she had a couple glasses of wine not sure if this was related or not but remembers feeling like everything went black for waking up on the floor with friends and family around her.  She states she has not otherwise had any recent falls or injuries or recent fevers, chills, cough, chest pain, sore throat, nausea, vomiting, diarrhea, pain with urination, rash or focal extremity weakness numbness or tingling.  No other acute concerns at this time.  No illicit drugs.  She states she is currently pain-free.  She is not sure when her last tetanus shot was.         Past Medical History:  Diagnosis Date   GERD (gastroesophageal reflux disease)    Infertility, female     Patient Active Problem List   Diagnosis Date Noted   Microhematuria 05/03/2015   Abdominal pain 05/03/2015   Nocturia 05/03/2015   Advanced maternal age, 1st pregnancy 12/15/2012   Primigravida 12/15/2012   Normal pregnancy, incidental 12/15/2012    Past Surgical History:  Procedure Laterality Date   none      Prior to Admission medications   Not on File    Allergies Patient has no known allergies.  Family History  Problem Relation Age of Onset   Urinary tract infection Mother    Nephrolithiasis Brother    Bladder Cancer Neg Hx    Kidney cancer Neg Hx    Prostate cancer Neg Hx    Breast cancer Neg Hx     Social History Social History   Tobacco Use   Smoking status: Never   Smokeless  tobacco: Never  Vaping Use   Vaping Use: Never used  Substance Use Topics   Alcohol use: No    Alcohol/week: 0.0 standard drinks   Drug use: No    Review of Systems  Review of Systems  Constitutional:  Negative for chills and fever.  HENT:  Negative for sore throat.   Eyes:  Negative for pain.  Respiratory:  Negative for cough and stridor.   Cardiovascular:  Negative for chest pain.  Gastrointestinal:  Negative for vomiting.  Skin:  Negative for rash.  Neurological:  Positive for loss of consciousness. Negative for seizures and headaches.  Psychiatric/Behavioral:  Negative for suicidal ideas.   All other systems reviewed and are negative.    ____________________________________________   PHYSICAL EXAM:  VITAL SIGNS: ED Triage Vitals  Enc Vitals Group     BP 05/11/21 2148 (!) 82/58     Pulse Rate 05/11/21 2148 71     Resp 05/11/21 2148 18     Temp 05/11/21 2148 97.7 F (36.5 C)     Temp Source 05/11/21 2148 Oral     SpO2 05/11/21 2148 100 %     Weight 05/11/21 2149 144 lb (65.3 kg)     Height 05/11/21 2149 5\' 4"  (1.626 m)     Head Circumference --      Peak Flow --      Pain Score 05/11/21  2148 4     Pain Loc --      Pain Edu? --      Excl. in GC? --    Vitals:   05/11/21 2148 05/11/21 2338  BP: (!) 82/58 109/70  Pulse: 71 84  Resp: 18 18  Temp: 97.7 F (36.5 C)   SpO2: 100% 100%   Physical Exam Vitals and nursing note reviewed.  Constitutional:      General: She is not in acute distress.    Appearance: She is well-developed.  HENT:     Head: Normocephalic.     Right Ear: External ear normal.     Left Ear: External ear normal.     Nose: Nose normal.  Eyes:     Conjunctiva/sclera: Conjunctivae normal.  Cardiovascular:     Rate and Rhythm: Normal rate and regular rhythm.     Heart sounds: No murmur heard. Pulmonary:     Effort: Pulmonary effort is normal. No respiratory distress.     Breath sounds: Normal breath sounds.  Abdominal:      Palpations: Abdomen is soft.     Tenderness: There is no abdominal tenderness.  Musculoskeletal:     Cervical back: Neck supple.  Skin:    General: Skin is warm and dry.     Capillary Refill: Capillary refill takes less than 2 seconds.  Neurological:     Mental Status: She is alert and oriented to person, place, and time.  Psychiatric:        Mood and Affect: Mood normal.    Cranial nerves II through XII grossly intact.  No pronator drift.  No finger dysmetria.  Symmetric 5/5 strength of all extremities.  Sensation intact to light touch in all extremities.  Unremarkable unassisted gait.  No tenderness step-offs or deformities over the C/T/L-spine.  2+ radial pulse.  Patient has 2 lacerations over the left forehead and left eyebrow.  There is also some very mild left periorbital ecchymosis.  Both lacerations are linear hemostatic approximately 3 cm in length.  No other obvious trauma to the face scalp head or neck. ____________________________________________   LABS (all labs ordered are listed, but only abnormal results are displayed)  Labs Reviewed  BASIC METABOLIC PANEL - Abnormal; Notable for the following components:      Result Value   Glucose, Bld 118 (*)    All other components within normal limits  CBC  HCG, QUANTITATIVE, PREGNANCY  CBG MONITORING, ED  TROPONIN I (HIGH SENSITIVITY)   ____________________________________________  EKG  Sinus rhythm with ventricular rate of 86, normal axis, unremarkable intervals without clear evidence of acute ischemia or significant arrhythmia. ____________________________________________  RADIOLOGY  ED MD interpretation:    Official radiology report(s): No results found.  ____________________________________________   PROCEDURES  Procedure(s) performed (including Critical Care):  Marland KitchenMarland KitchenLaceration Repair  Date/Time: 05/12/2021 6:41 AM Performed by: Gilles Chiquito, MD Authorized by: Gilles Chiquito, MD   Consent:     Consent obtained:  Verbal   Consent given by:  Patient   Risks, benefits, and alternatives were discussed: yes     Risks discussed:  Pain and infection   Alternatives discussed:  No treatment Universal protocol:    Patient identity confirmed:  Verbally with patient Anesthesia:    Anesthesia method:  Local infiltration   Local anesthetic:  Lidocaine 1% WITH epi Laceration details:    Location:  Face   Face location:  L eyebrow   Length (cm):  3 Exploration:    Limited defect  created (wound extended): no     Imaging outcome: foreign body not noted     Contaminated: no   Treatment:    Area cleansed with:  Saline   Amount of cleaning:  Extensive   Irrigation solution:  Sterile saline   Irrigation method:  Syringe   Visualized foreign bodies/material removed: no     Debridement:  None   Undermining:  None   Scar revision: no   Skin repair:    Repair method:  Sutures   Suture size:  6-0   Suture material:  Prolene   Suture technique:  Simple interrupted   Number of sutures:  4 Approximation:    Approximation:  Close Repair type:    Repair type:  Simple Post-procedure details:    Dressing:  Open (no dressing)   Procedure completion:  Tolerated well, no immediate complications .Marland KitchenLaceration Repair  Date/Time: 05/12/2021 6:42 AM Performed by: Gilles Chiquito, MD Authorized by: Gilles Chiquito, MD   Consent:    Consent obtained:  Verbal   Consent given by:  Patient   Risks, benefits, and alternatives were discussed: yes     Risks discussed:  Pain and infection Universal protocol:    Procedure explained and questions answered to patient or proxy's satisfaction: yes     Patient identity confirmed:  Verbally with patient Anesthesia:    Anesthesia method:  Local infiltration   Local anesthetic:  Lidocaine 1% WITH epi Laceration details:    Location:  Scalp   Scalp location:  Frontal   Length (cm):  3 Exploration:    Limited defect created (wound extended): no      Hemostasis achieved with:  Direct pressure   Imaging outcome: foreign body not noted     Wound exploration: entire depth of wound visualized     Contaminated: no   Treatment:    Area cleansed with:  Saline   Amount of cleaning:  Extensive   Irrigation solution:  Sterile saline   Irrigation method:  Syringe   Debridement:  None   Undermining:  None   Scar revision: no   Skin repair:    Repair method:  Sutures   Suture size:  6-0   Suture material:  Prolene   Suture technique:  Simple interrupted   Number of sutures:  4 Approximation:    Approximation:  Close Repair type:    Repair type:  Simple Post-procedure details:    Dressing:  Open (no dressing)   Procedure completion:  Tolerated well, no immediate complications   ____________________________________________   INITIAL IMPRESSION / ASSESSMENT AND PLAN / ED COURSE      Patient presents with above-stated history exam for assessment after a syncopal episode with patient hitting her head and sustaining lacerations to noted over the left eye.  On arrival patient initial BP noted below 82/58 although on recheck it is 109/70.  Patient is denying any acute symptoms including any headache or vision changes.  She has a nonfocal neurological exam and has no other obvious evidence of trauma other than lacerations with ecchymosis over the left eye.  Tetanus updated and lacerations repaired per procedure note above.  With regard to syncopal episode that preceded this is certainly possible is related to excessive alcohol intake.  Patient does not appear acutely intoxicated on my exam and does appear clinically sober.  No neurological deficits to suggest CVA.  ECG not suggestive of arrhythmia or ACS.  hCG is negative.  Troponin negative.  CBC shows WBC count  of 9.8 without evidence of acute anemia.  BMP shows no significant electrolyte metabolic derangements.  Extensive discussion with patient regarding concerns for possible underlying skull  fracture or intracranial hemorrhage or other significant injury leading mild to see on exam of the skull.  Explained my recommendation to obtain a CT head although patient states she understands and wishes to decline this at this time.  Discharged AMA.  I think patient had capacity to refuse this.  She will follow-up with her PCP for wound check and suture removal in 7 to 10 days.  No other acute concerns on discharge.  Discharged in stable condition.        ____________________________________________   FINAL CLINICAL IMPRESSION(S) / ED DIAGNOSES  Final diagnoses:  Facial laceration, initial encounter  Syncope, unspecified syncope type  Fall, initial encounter    Medications  lidocaine-EPINEPHrine (XYLOCAINE W/EPI) 2 %-1:100000 (with pres) injection 20 mL (has no administration in time range)  sodium chloride 0.9 % bolus 1,000 mL (0 mLs Intravenous Stopped 05/12/21 0326)  acetaminophen (TYLENOL) tablet 1,000 mg (1,000 mg Oral Given 05/11/21 2209)  Tdap (BOOSTRIX) injection 0.5 mL (0.5 mLs Intramuscular Given 05/12/21 0609)     ED Discharge Orders     None        Note:  This document was prepared using Dragon voice recognition software and may include unintentional dictation errors.    Gilles Chiquito, MD 05/12/21 [phone]

## 2021-05-12 NOTE — ED Notes (Signed)
Pt refusing discharge vital signs at this time.

## 2022-02-07 ENCOUNTER — Other Ambulatory Visit: Payer: Self-pay | Admitting: Internal Medicine

## 2022-03-27 ENCOUNTER — Encounter: Payer: Self-pay | Admitting: Licensed Practical Nurse

## 2022-03-27 ENCOUNTER — Other Ambulatory Visit (HOSPITAL_COMMUNITY)
Admission: RE | Admit: 2022-03-27 | Discharge: 2022-03-27 | Disposition: A | Discharge status: Home / Self Care | Payer: 59 | Source: Ambulatory Visit | Attending: Licensed Practical Nurse | Admitting: Licensed Practical Nurse

## 2022-03-27 ENCOUNTER — Ambulatory Visit (INDEPENDENT_AMBULATORY_CARE_PROVIDER_SITE_OTHER): Payer: 59 | Admitting: Licensed Practical Nurse

## 2022-03-27 VITALS — BP 118/74 | Ht 64.0 in | Wt 152.0 lb

## 2022-03-27 DIAGNOSIS — Z124 Encounter for screening for malignant neoplasm of cervix: Secondary | ICD-10-CM | POA: Insufficient documentation

## 2022-03-27 DIAGNOSIS — Z01419 Encounter for gynecological examination (general) (routine) without abnormal findings: Secondary | ICD-10-CM | POA: Diagnosis not present

## 2022-03-27 NOTE — Progress Notes (Signed)
Gynecology Annual Exam  PCP: Danella Penton, MD  Chief Complaint:  Chief Complaint  Patient presents with   Annual Exam    History of Present Illness: Patient is a 46 y.o. G1P1001 presents for annual exam. The patient has no complaints today. Has general questions about menopause, she is not experience any symptoms but has friend that are. Her mother went through menopause just before 46. Desires yearly pap.   LMP: Patient's last menstrual period was 03/13/2022 (exact date). Average Interval: regular, 3 weeks Duration of flow: 5 days Heavy Menses: no Clots: no Intermenstrual Bleeding: no Postcoital Bleeding: yes, usually if IC occurs 1 week before start of cycle.  Dysmenorrhea: sometimes   The patient is sexually active. She currently uses none (husband had vasectomy reversal but does not produce much sperm) for contraception. She denies dyspareunia.  The patient does perform self breast exams.  There is no notable family history of breast or ovarian cancer in her family.  The patient wears seatbelts: yes.   The patient has regular exercise: yes.   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    Review of Systems: Review of Systems  Constitutional: Negative.   Eyes: Negative.   Respiratory: Negative.    Cardiovascular: Negative.   Gastrointestinal: Negative.   Genitourinary:        Needs to empty her bladder immediately when she has the urge to urinate. Denies urinary incontinence.   Musculoskeletal: Negative.   Skin: Negative.   Neurological: Negative.   Endo/Heme/Allergies: Negative.   Psychiatric/Behavioral: Negative.      Past Medical History:  Patient Active Problem List    Diagnosis Date Noted   Microhematuria 05/03/2015   Abdominal pain 05/03/2015   Nocturia 05/03/2015   Advanced maternal age, 1st pregnancy 12/15/2012   Primigravida 12/15/2012   Normal pregnancy, incidental 12/15/2012    Past Surgical History:  Past Surgical History:  Procedure Laterality Date   none      Gynecologic History:  Patient's last menstrual period was 03/13/2022 (exact date). Contraception: none Last Pap: Results were: 12/2020 no abnormalities  Last mammogram: 2022 Results were: BI-RAD I  Obstetric History: G1P1001  Family History:  Family History  Problem Relation Age of Onset   Urinary tract infection Mother    Nephrolithiasis Brother    Bladder Cancer Neg Hx    Kidney cancer Neg Hx    Prostate cancer Neg Hx    Breast cancer Neg Hx     Social History:  Social History   Socioeconomic History   Marital status: Married    Spouse name: Not on file   Number of children: Not on file   Years of education: Not on file   Highest education level: Not on file  Occupational History   Not on file  Tobacco Use   Smoking status: Never   Smokeless tobacco: Never  Vaping Use   Vaping Use: Never used  Substance and Sexual Activity   Alcohol use: No    Alcohol/week: 0.0 standard drinks of alcohol   Drug use: No   Sexual activity: Yes    Birth control/protection: None  Other Topics Concern   Not  on file  Social History Narrative   Not on file   Social Determinants of Health   Financial Resource Strain: Not on file  Food Insecurity: Not on file  Transportation Needs: Not on file  Physical Activity: Not on file  Stress: Not on file  Social Connections: Not on file  Intimate Partner Violence: Not on file    Allergies:  No Known Allergies  Medications: Prior to Admission medications   Not on File    Physical Exam Vitals: Blood pressure 118/74, height 5\' 4"  (1.626 m), weight 152 lb (68.9 kg), last menstrual period 03/13/2022.  General:  NAD HEENT: normocephalic, anicteric Thyroid: no enlargement, no palpable nodules Pulmonary: No increased work of breathing, CTAB Cardiovascular: RRR, distal pulses 2+ Breast: Breast symmetrical, no tenderness, no palpable nodules or masses, no skin or nipple retraction present, no nipple discharge.  No axillary or supraclavicular lymphadenopathy. Abdomen: NABS, soft, non-tender, non-distended.  Umbilicus without lesions.  No hepatomegaly, splenomegaly or masses palpable. No evidence of hernia  Genitourinary:  External: Normal external female genitalia.  Normal urethral meatus, normal Bartholin's and Skene's glands.    Vagina: Normal vaginal mucosa, no evidence of prolapse.  Good tone  Cervix: Grossly normal in appearance, no bleeding. Small cyst at 5 o'clock   Uterus: Non-enlarged, mobile, normal contour.  No CMT  Adnexa: ovaries non-enlarged, no adnexal masses  Rectal: deferred  Lymphatic: no evidence of inguinal lymphadenopathy Extremities: no edema, erythema, or tenderness Neurologic: Grossly intact Psychiatric: mood appropriate, affect full     Assessment: 45 y.o. G1P1001 routine annual exam  Plan: Problem List Items Addressed This Visit   None   1) Mammogram - recommend yearly screening mammogram.  Mammogram Was ordered today   2) STI screening  wasoffered and declined  3) ASCCP guidelines and rational discussed.  Patient opts for yearly screening interval  4) Contraception - the patient is currently using  none.  She is happy with her current form of contraception and plans to continue  5) Colonoscopy -- Screening recommended starting at age 16 for average risk individuals, age 78 for individuals deemed at increased risk (including African Americans) and recommended to continue until age 52.  For patient age 31-85 individualized approach is recommended.  Gold standard screening is via colonoscopy, Cologuard screening is an acceptable alternative for patient unwilling or  unable to undergo colonoscopy.  "Colorectal cancer screening for average?risk adults: 2018 guideline update from the American Cancer Society"[place]: A Cancer Journal for Clinicians: Dec 18, 2016   6) Routine healthcare maintenance including cholesterol, diabetes screening discussed managed by PCP  7) Menopause: At this stage it is important to maintain a healthy weight, exercise-including weight bearing exercises and eat healthy.  There is no need for hormones until someone is experiencing symptoms that are bothersome to them . Rec taking Calcium and Vit D supplement.   8) RTC 1 year   December 20, 2016, CNM  [facility], [facility] 03/27/2022, 9:06 AM

## 2022-03-29 LAB — CYTOLOGY - PAP
Comment: NEGATIVE
Diagnosis: NEGATIVE
High risk HPV: NEGATIVE

## 2022-04-23 ENCOUNTER — Ambulatory Visit
Admission: RE | Admit: 2022-04-23 | Discharge: 2022-04-23 | Disposition: A | Discharge status: Home / Self Care | Payer: 59 | Source: Ambulatory Visit | Attending: Licensed Practical Nurse | Admitting: Licensed Practical Nurse

## 2022-04-23 DIAGNOSIS — Z1231 Encounter for screening mammogram for malignant neoplasm of breast: Secondary | ICD-10-CM | POA: Insufficient documentation

## 2022-04-23 DIAGNOSIS — Z01419 Encounter for gynecological examination (general) (routine) without abnormal findings: Secondary | ICD-10-CM | POA: Diagnosis present

## 2023-01-20 IMAGING — MG MM DIGITAL SCREENING BILAT W/ TOMO AND CAD
6 of 10 series · 6 of 30 positions shown · non-contrast
Comparison: Previous exam(s).

CLINICAL DATA: Screening.

EXAM:
DIGITAL SCREENING BILATERAL MAMMOGRAM WITH TOMOSYNTHESIS AND CAD
TECHNIQUE: Bilateral screening digital craniocaudal and mediolateral oblique
mammograms were obtained. Bilateral screening digital breast
tomosynthesis was performed. The images were evaluated with
computer-aided detection.

[R CC synth-2D (1 of 2)]
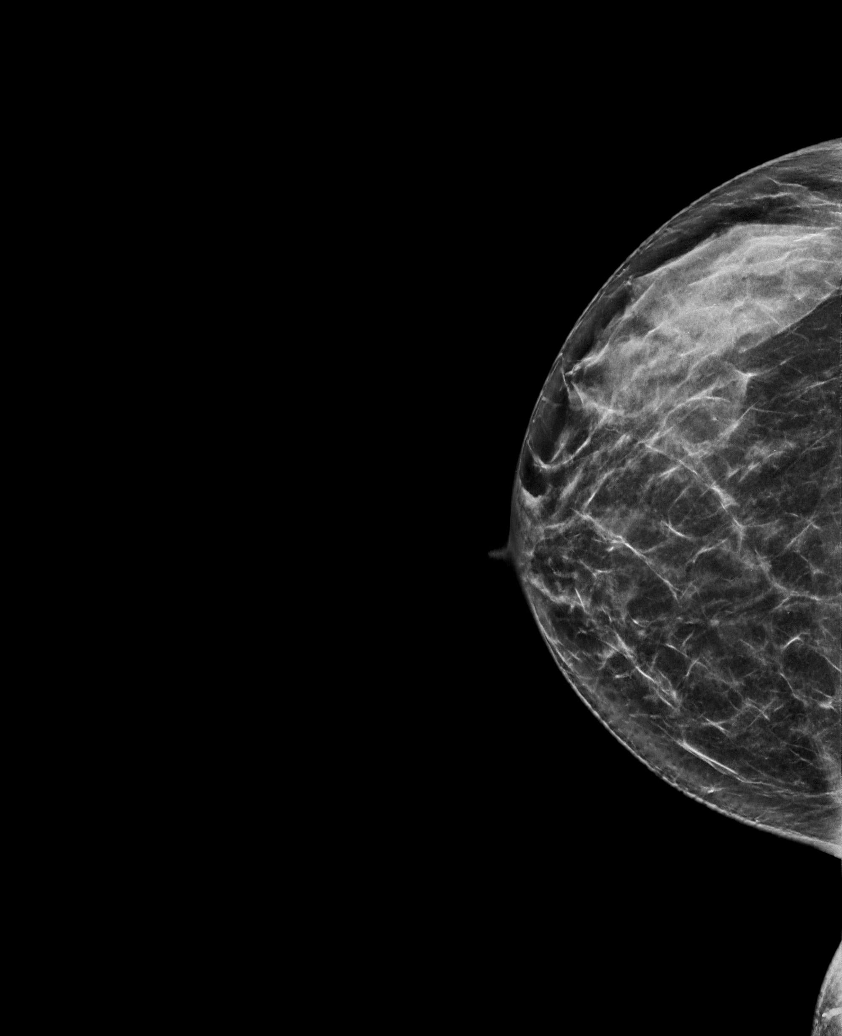

[L CC synth-2D]
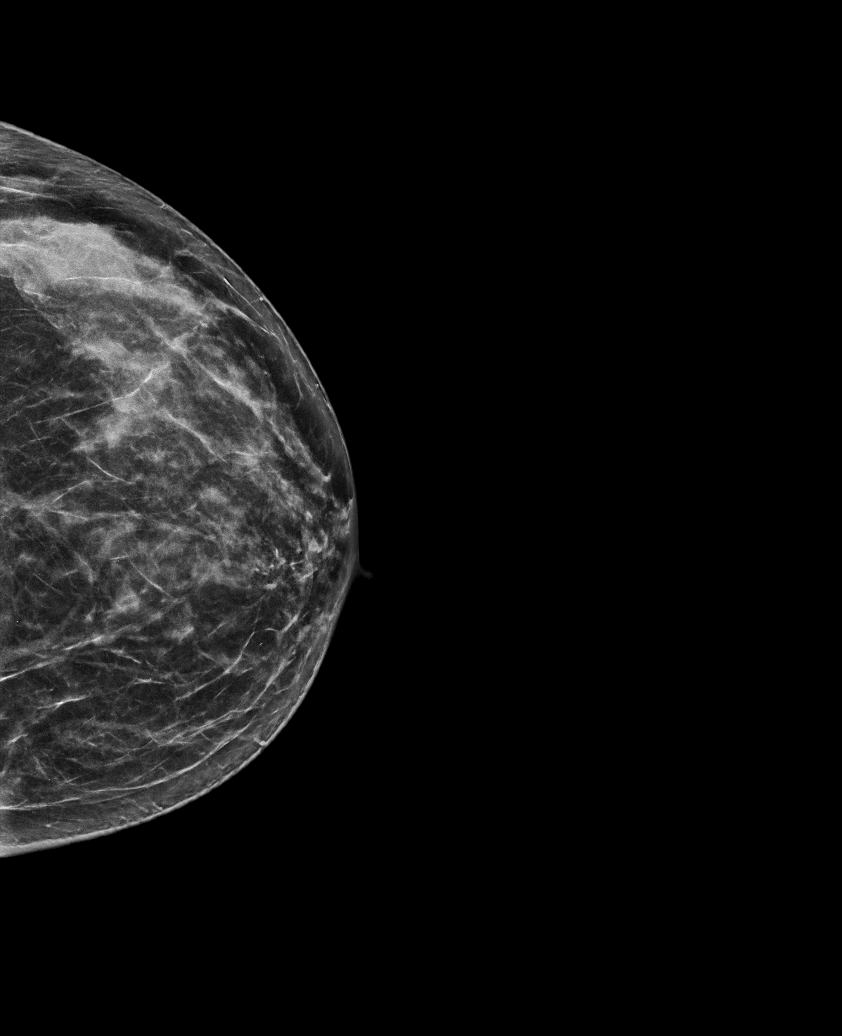

[L MLO synth-2D]
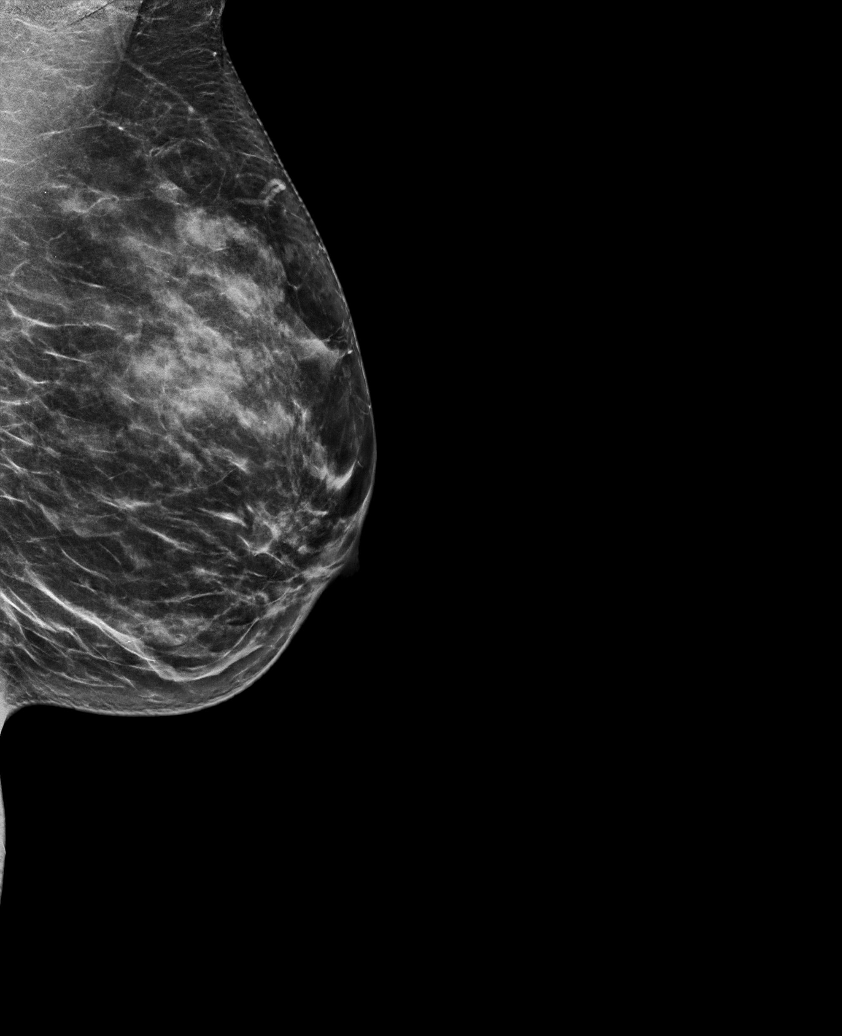

[R CC synth-2D (2 of 2)]
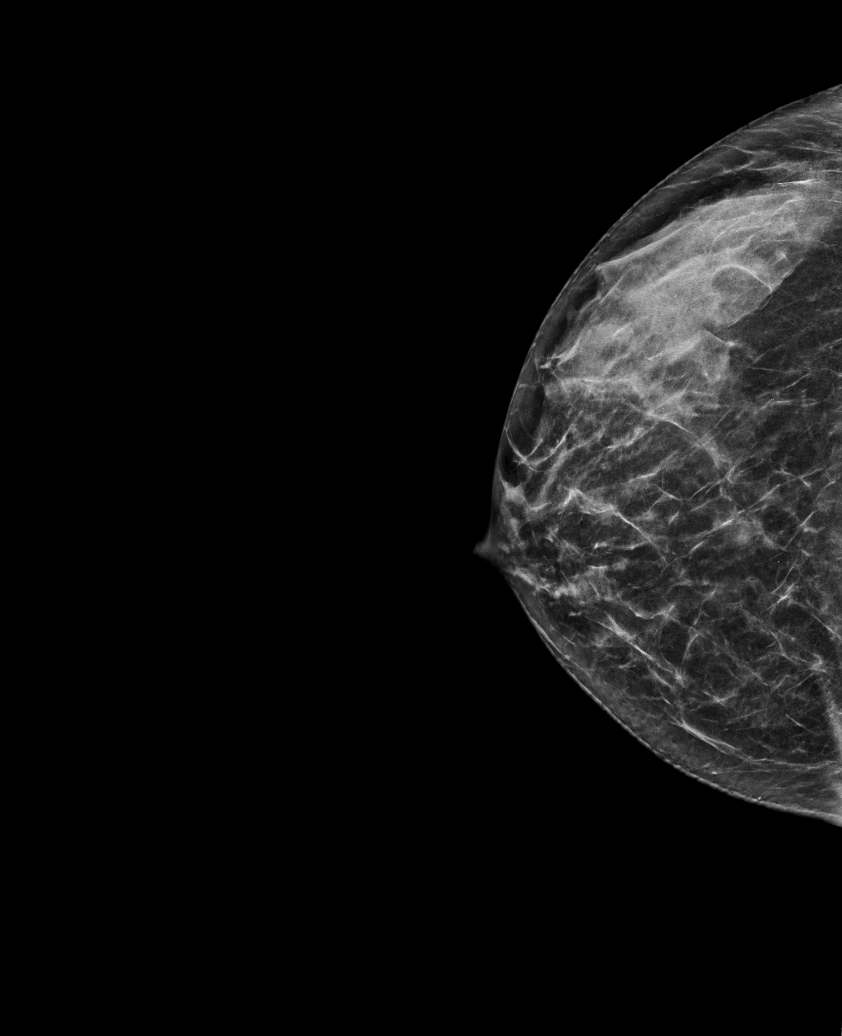

[R MLO synth-2D]
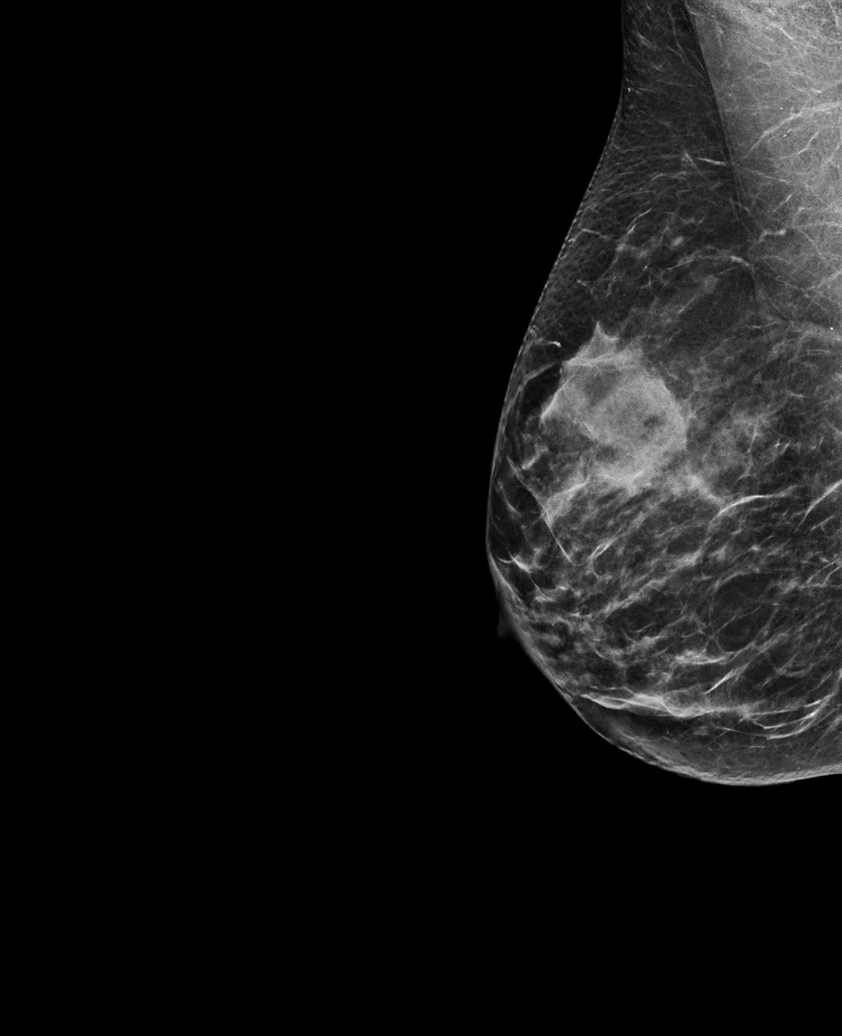

[L CC tomo · tomo slice 35/69.0]
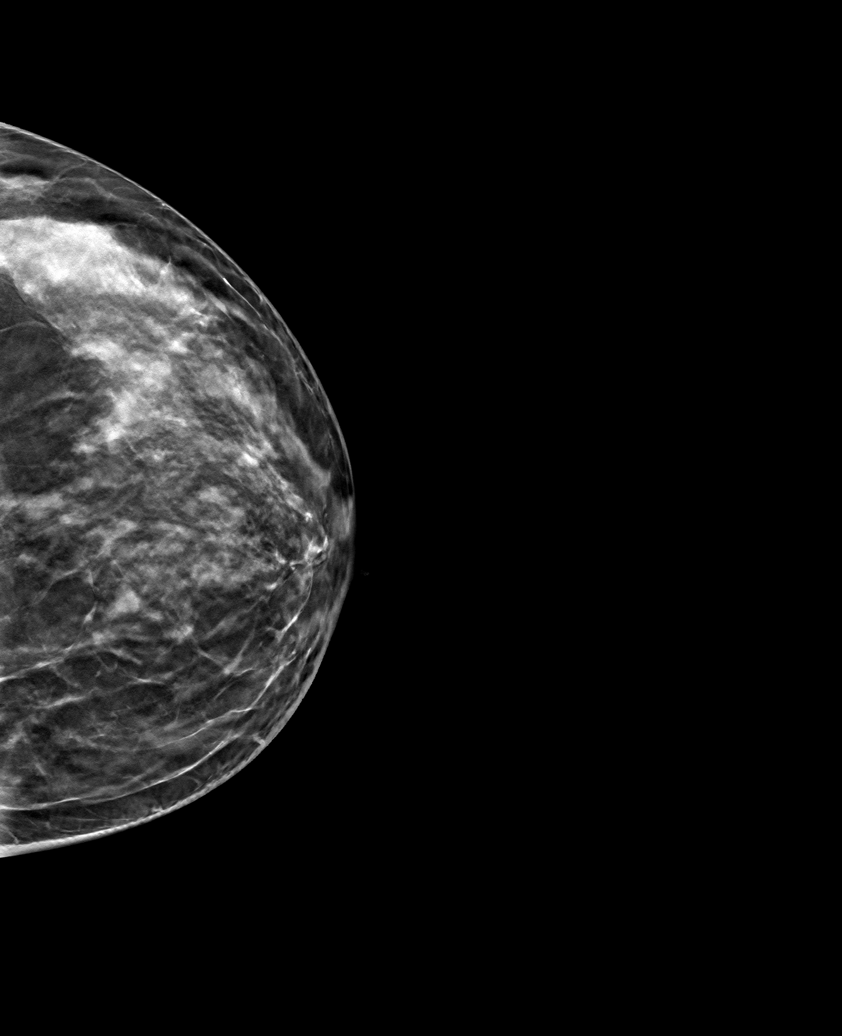

[6 of 30 positions shown; findings below may reference images not displayed]

ACR Breast Density Category c: The breast tissue is heterogeneously
dense, which may obscure small masses.
FINDINGS: There are no findings suspicious for malignancy. The images were
evaluated with computer-aided detection.
IMPRESSION: No mammographic evidence of malignancy. A result letter of this
screening mammogram will be mailed directly to the patient.

RECOMMENDATION:
Screening mammogram in one year. (Code:T4-5-GWO)

BI-RADS CATEGORY  1: Negative.

## 2023-04-02 ENCOUNTER — Other Ambulatory Visit: Payer: Self-pay | Admitting: Licensed Practical Nurse

## 2023-04-02 ENCOUNTER — Encounter: Payer: Self-pay | Admitting: Licensed Practical Nurse

## 2023-04-02 ENCOUNTER — Ambulatory Visit (INDEPENDENT_AMBULATORY_CARE_PROVIDER_SITE_OTHER): Payer: 59 | Admitting: Licensed Practical Nurse

## 2023-04-02 ENCOUNTER — Other Ambulatory Visit (HOSPITAL_COMMUNITY)
Admission: RE | Admit: 2023-04-02 | Discharge: 2023-04-02 | Disposition: A | Discharge status: Home / Self Care | Payer: 59 | Source: Ambulatory Visit | Attending: Licensed Practical Nurse | Admitting: Licensed Practical Nurse

## 2023-04-02 VITALS — BP 95/59 | HR 68 | Ht 64.0 in | Wt 154.2 lb

## 2023-04-02 DIAGNOSIS — Z124 Encounter for screening for malignant neoplasm of cervix: Secondary | ICD-10-CM | POA: Insufficient documentation

## 2023-04-02 DIAGNOSIS — Z1231 Encounter for screening mammogram for malignant neoplasm of breast: Secondary | ICD-10-CM

## 2023-04-02 DIAGNOSIS — Z01419 Encounter for gynecological examination (general) (routine) without abnormal findings: Secondary | ICD-10-CM | POA: Insufficient documentation

## 2023-04-02 DIAGNOSIS — N888 Other specified noninflammatory disorders of cervix uteri: Secondary | ICD-10-CM

## 2023-04-02 NOTE — Progress Notes (Signed)
Gynecology Annual Exam  PCP: Danella Penton, MD  Chief Complaint:  Chief Complaint  Patient presents with   Gynecologic Exam    History of Present Illness: Patient is a 47 y.o. G1P1001 presents for annual exam. The patient has no complaints today. She would like a pap today. Seems curious about menopause, denies any concerning symptoms, is noticing her body is aging, she has recently needed a new prescription for her glasses.   LMP: Patient's last menstrual period was 03/29/2023 (exact date). Average Interval: regular, 3.5 weeks Duration of flow: 5 days Heavy Menses: no Clots: no Intermenstrual Bleeding: no Postcoital Bleeding: yes, it used to be only near the start of her cycle, now iit is more frequent.  Dysmenorrhea: no   The patient is sexually active with 1 female partner. She currently uses none for contraception (he had a vasectomy reversal but has a low sperm count). She denies dyspareunia.  The patient does perform self breast exams.  There is no notable family history of breast or ovarian cancer in her family.  The patient wears seatbelts: no.   The patient has regular exercise: yes.  Walking and [employer] Works as a Engineer, civil (consulting) Lives with her husband and daughter, feel safe Has "normal" stress level related to work and balancing family life  PCP: sees this month Wear glasses eye exam up to date Dentist exam up to date   The patient denies current symptoms of depression.    Review of Systems: ROS see HPI  Past Medical History:  Patient Active Problem List   Diagnosis Date Noted Date Diagnosed   Microhematuria 05/03/2015    Abdominal pain 05/03/2015    Nocturia 05/03/2015     Past Surgical History:  Past Surgical History:  Procedure Laterality Date   none      Gynecologic History:  Patient's last menstrual period was 03/29/2023 (exact date). Contraception: none Last Pap: Results were: 2023 no abnormalities  Last mammogram: 04/2022 Results were:  BI-RAD I  Obstetric History: G1P1001  Family History:  Family History  Problem Relation Age of Onset   Urinary tract infection Mother    Nephrolithiasis Brother    Bladder Cancer Neg Hx    Kidney cancer Neg Hx    Prostate cancer Neg Hx    Breast cancer Neg Hx     Social History:  Social History   Socioeconomic History   Marital status: Married    Spouse name: Not on file   Number of children: Not on file   Years of education: Not on file   Highest education level: Not on file  Occupational History   Not on file  Tobacco Use   Smoking status: Never   Smokeless tobacco: Never  Vaping Use   Vaping status: Never Used  Substance and Sexual Activity   Alcohol use: No    Alcohol/week: 0.0 standard drinks of alcohol   Drug use: No   Sexual activity: Yes    Birth control/protection: None  Other Topics Concern   Not on file  Social History Narrative   Not on file   Social Determinants of Health   Financial Resource Strain: Not on file  Food Insecurity: Not on file  Transportation Needs: Not on file  Physical Activity: Not on file  Stress: Not on file  Social Connections: Not on file  Intimate Partner Violence: Not on file    Allergies:  No Known Allergies  Medications: Prior to Admission medications   Not on File  Physical Exam Vitals: Blood pressure (!) 95/59, pulse 68, height 5\' 4"  (1.626 m), weight 154 lb 3.2 oz (69.9 kg), last menstrual period 03/29/2023.  General: NAD HEENT: normocephalic, anicteric Thyroid: no enlargement, no palpable nodules Pulmonary: No increased work of breathing, CTAB Cardiovascular: RRR, distal pulses 2+ Breast: Breast symmetrical, no tenderness, no palpable nodules or masses, no skin or nipple retraction present, no nipple discharge.  No axillary or supraclavicular lymphadenopathy. Abdomen: NABS, soft, non-tender, non-distended.  Umbilicus without lesions.  No hepatomegaly, splenomegaly or masses palpable. No evidence of  hernia  Genitourinary:  External: Normal external female genitalia.  Normal urethral meatus, normal Bartholin's and Skene's glands.    Vagina: Normal vaginal mucosa, no evidence of prolapse.  Good tone   Cervix: Grossly normal in appearance, no bleeding. 3 small cyst noted at 7 O'clock, 6 O'clock and 3 O'clock.   Uterus: Non-enlarged, mobile, normal contour.  No CMT  Adnexa: ovaries non-enlarged, no adnexal masses  Rectal: deferred  Lymphatic: no evidence of inguinal lymphadenopathy Extremities: no edema, erythema, or tenderness Neurologic: Grossly intact Psychiatric: mood appropriate, affect full   Assessment: 47 y.o. G1P1001 routine annual exam  Plan: Problem List Items Addressed This Visit   None Visit Diagnoses     Well woman exam    -  Primary   Relevant Orders   Cytology - PAP   Ambulatory referral to Gastroenterology   MM DIGITAL SCREENING BILATERAL   Cervical cancer screening       Relevant Orders   Cytology - PAP   Nabothian cyst           1) Mammogram - recommend yearly screening mammogram.  Mammogram Was ordered today   2) STI screening  wasoffered and declined  3) ASCCP guidelines and rational discussed.  Patient opts for yearly screening interval  4) Contraception - the patient is currently using  none.  She is happy with her current form of contraception and plans to continue  5) Colonoscopy -- Screening recommended starting at age 29 for average risk individuals, age 18 for individuals deemed at increased risk (including African Americans) and recommended to continue until age 34.  For patient age 38-85 individualized approach is recommended.  Gold standard screening is via colonoscopy, Cologuard screening is an acceptable alternative for patient unwilling or unable to undergo colonoscopy.  "Colorectal cancer screening for average?risk adults: 2018 guideline update from the American Cancer Society"CA: A Cancer Journal for Clinicians: Dec 18, 2016 ordered  today.  6) Routine healthcare maintenance including cholesterol, diabetes screening discussed managed by PCP  7) Nabothian cyst: reviewed bleeding after IC could be related to the presence of these cyst. Reviewed the cyst are benign and do not require treatment. Pt feel reassured, admits she is not too concerned about the bleeding,   Carie Caddy, CNM  [facility] 04/02/2023, 1:07 PM

## 2023-04-03 LAB — CYTOLOGY - PAP
Chlamydia: NEGATIVE
Comment: NEGATIVE
Comment: NEGATIVE
Comment: NORMAL
Diagnosis: NEGATIVE
High risk HPV: NEGATIVE
Neisseria Gonorrhea: NEGATIVE

## 2023-04-04 ENCOUNTER — Other Ambulatory Visit: Payer: Self-pay

## 2023-04-04 ENCOUNTER — Telehealth: Payer: Self-pay

## 2023-04-04 DIAGNOSIS — Z1211 Encounter for screening for malignant neoplasm of colon: Secondary | ICD-10-CM

## 2023-04-04 MED ORDER — NA SULFATE-K SULFATE-MG SULF 17.5-3.13-1.6 GM/177ML PO SOLN: 1.0000 | Freq: Once | ORAL | 0 refills | Status: AC

## 2023-04-04 NOTE — Telephone Encounter (Signed)
Gastroenterology Pre-Procedure Review  Request Date: 04/11/23 Requesting Physician: Dr. Tobi Bastos  PATIENT REVIEW QUESTIONS: The patient responded to the following health history questions as indicated:    1. Are you having any GI issues? no 2. Do you have a personal history of Polyps? no 3. Do you have a family history of Colon Cancer or Polyps? Mother colon polyps, grandmother spleen cancer 4. Diabetes Mellitus? no 5. Joint replacements in the past 12 months?no 6. Major health problems in the past 3 months?no 7. Any artificial heart valves, MVP, or defibrillator?no    MEDICATIONS & ALLERGIES:    Patient reports the following regarding taking any anticoagulation/antiplatelet therapy:   Plavix, Coumadin, Eliquis, Xarelto, Lovenox, Pradaxa, Brilinta, or Effient? no Aspirin? no  Patient confirms/reports the following medications:  No current outpatient medications on file.   No current facility-administered medications for this visit.    Patient confirms/reports the following allergies:  No Known Allergies  No orders of the defined types were placed in this encounter.   AUTHORIZATION INFORMATION Primary Insurance: 1D#: Group #:  Secondary Insurance: 1D#: Group #:  SCHEDULE INFORMATION: Date: 04/11/23 Time: Location: [facility]

## 2023-04-10 ENCOUNTER — Encounter: Payer: Self-pay | Admitting: Gastroenterology

## 2023-04-11 ENCOUNTER — Ambulatory Visit: Payer: 59 | Admitting: Certified Registered Nurse Anesthetist

## 2023-04-11 ENCOUNTER — Encounter: Payer: Self-pay | Admitting: Gastroenterology

## 2023-04-11 ENCOUNTER — Ambulatory Visit
Admission: RE | Admit: 2023-04-11 | Discharge: 2023-04-11 | Disposition: A | Discharge status: Home / Self Care | Payer: 59 | Attending: Gastroenterology | Admitting: Gastroenterology

## 2023-04-11 ENCOUNTER — Encounter
Admission: RE | Disposition: A | Discharge status: Home / Self Care | Payer: Self-pay | Source: Home / Self Care | Attending: Gastroenterology

## 2023-04-11 DIAGNOSIS — Z1211 Encounter for screening for malignant neoplasm of colon: Secondary | ICD-10-CM | POA: Insufficient documentation

## 2023-04-11 DIAGNOSIS — Z83719 Family history of colon polyps, unspecified: Secondary | ICD-10-CM | POA: Diagnosis not present

## 2023-04-11 DIAGNOSIS — K219 Gastro-esophageal reflux disease without esophagitis: Secondary | ICD-10-CM | POA: Insufficient documentation

## 2023-04-11 HISTORY — PX: COLONOSCOPY WITH PROPOFOL: SHX5780

## 2023-04-11 LAB — POCT PREGNANCY, URINE: Preg Test, Ur: NEGATIVE

## 2023-04-11 SURGERY — COLONOSCOPY WITH PROPOFOL
Anesthesia: General

## 2023-04-11 MED ORDER — PROPOFOL 10 MG/ML IV BOLUS
INTRAVENOUS | Status: AC
  Filled 2023-04-11: qty 40

## 2023-04-11 MED ORDER — SODIUM CHLORIDE 0.9 % IV SOLN
INTRAVENOUS | Status: DC
  Administered 2023-04-11: 1000 mL via INTRAVENOUS

## 2023-04-11 MED ORDER — PROPOFOL 500 MG/50ML IV EMUL
INTRAVENOUS | Status: DC | PRN
  Administered 2023-04-11: 75 ug/kg/min via INTRAVENOUS

## 2023-04-11 MED ORDER — PROPOFOL 10 MG/ML IV BOLUS
INTRAVENOUS | Status: DC | PRN
  Administered 2023-04-11: 50 mg via INTRAVENOUS

## 2023-04-11 NOTE — Op Note (Signed)
[facility] Patient Name: Vanessa Cox Procedure Date: 04/11/2023 8:51 AM MRN: [id number] Account #: [id number] Date of Birth: May 03, 1976 Admit Type: Inpatient Age: 46 Room: [facility] ENDO ROOM 4 Gender: Female Note Status: Finalized Instrument Name: Prentice Docker [id number] Procedure:             Colonoscopy Indications:           Screening for colorectal malignant neoplasm Providers:             Wyline Mood MD, MD Referring MD:          Wyline Mood MD, MD (Referring MD), Danella Penton, MD                         (Referring MD) Medicines:             Monitored Anesthesia Care Complications:         No immediate complications. Procedure:             Pre-Anesthesia Assessment:                        - Prior to the procedure, a History and Physical was                         performed, and patient medications, allergies and                         sensitivities were reviewed. The patient's tolerance                         of previous anesthesia was reviewed.                        - The risks and benefits of the procedure and the                         sedation options and risks were discussed with the                         patient. All questions were answered and informed                         consent was obtained.                        - ASA Grade Assessment: II - A patient with mild                         systemic disease.                        After obtaining informed consent, the colonoscope was                         passed under direct vision. Throughout the procedure,                         the patient's blood pressure, pulse, and oxygen                         saturations  were monitored continuously. The                         Colonoscope was introduced through the anus and                         advanced to the the cecum, identified by the                         appendiceal orifice. The colonoscopy was performed                          with ease. The patient tolerated the procedure well.                         The quality of the bowel preparation was excellent.                         The ileocecal valve, appendiceal orifice, and rectum                         were photographed. Findings:      The perianal and digital rectal examinations were normal.      The entire examined colon appeared normal on direct and retroflexion       views. Impression:            - The entire examined colon is normal on direct and                         retroflexion views.                        - No specimens collected. Recommendation:        - Discharge patient to home (with escort).                        - Resume previous diet.                        - Continue present medications.                        - Repeat colonoscopy in 10 years for screening                         purposes. Wyline Mood, MD Wyline Mood MD, MD 04/11/2023 9:20:46 AM This report has been signed electronically. Number of Addenda: 0 Note Initiated On: 04/11/2023 8:51 AM Scope Withdrawal Time: 0 hours 9 minutes 13 seconds  Total Procedure Duration: 0 hours 12 minutes 45 seconds  Estimated Blood Loss:  Estimated blood loss: none.      [facility]

## 2023-04-11 NOTE — H&P (Signed)
     Wyline Mood, MD [address], [place], [place], [address] [address], [address], [place], [place], [address] Phone: [phone]  Fax: [phone]  Primary Care Physician:  Danella Penton, MD   Pre-Procedure History & Physical: HPI:  Vanessa Cox is a 47 y.o. female is here for an colonoscopy.   Past Medical History:  Diagnosis Date   GERD (gastroesophageal reflux disease)    Infertility, female     Past Surgical History:  Procedure Laterality Date   none      Prior to Admission medications   Not on File    Allergies as of 04/04/2023   (No Known Allergies)    Family History  Problem Relation Age of Onset   Urinary tract infection Mother    Nephrolithiasis Brother    Bladder Cancer Neg Hx    Kidney cancer Neg Hx    Prostate cancer Neg Hx    Breast cancer Neg Hx     Social History   Socioeconomic History   Marital status: Married    Spouse name: Not on file   Number of children: Not on file   Years of education: Not on file   Highest education level: Not on file  Occupational History   Not on file  Tobacco Use   Smoking status: Never   Smokeless tobacco: Never  Vaping Use   Vaping status: Never Used  Substance and Sexual Activity   Alcohol use: No    Alcohol/week: 0.0 standard drinks of alcohol   Drug use: No   Sexual activity: Yes    Birth control/protection: None  Other Topics Concern   Not on file  Social History Narrative   Not on file   Social Determinants of Health   Financial Resource Strain: Not on file  Food Insecurity: Not on file  Transportation Needs: Not on file  Physical Activity: Not on file  Stress: Not on file  Social Connections: Not on file  Intimate Partner Violence: Not on file    Review of Systems: See HPI, otherwise negative ROS  Physical Exam: BP 106/72   Pulse 77   Temp (!) 97.2 F (36.2 C) (Temporal)   Resp 18   Ht 5\' 4"  (1.626 m)   Wt 68.5 kg   LMP 03/29/2023 (Exact Date)    SpO2 100%   BMI 25.92 kg/m  General:   Alert,  pleasant and cooperative in NAD Head:  Normocephalic and atraumatic. Neck:  Supple; no masses or thyromegaly. Lungs:  Clear throughout to auscultation, normal respiratory effort.    Heart:  +S1, +S2, Regular rate and rhythm, No edema. Abdomen:  Soft, nontender and nondistended. Normal bowel sounds, without guarding, and without rebound.   Neurologic:  Alert and  oriented x4;  grossly normal neurologically.  Impression/Plan: Vanessa Cox is here for an colonoscopy to be performed for Screening colonoscopy mother had colon polyps Risks, benefits, limitations, and alternatives regarding  colonoscopy have been reviewed with the patient.  Questions have been answered.  All parties agreeable.   Wyline Mood, MD  04/11/2023, 8:14 AM

## 2023-04-11 NOTE — Transfer of Care (Signed)
Immediate Anesthesia Transfer of Care Note  Patient: Vanessa Cox  Procedure(s) Performed: COLONOSCOPY WITH PROPOFOL  Patient Location: PACU  Anesthesia Type:General  Level of Consciousness: awake, alert , and oriented  Airway & Oxygen Therapy: Patient Spontanous Breathing  Post-op Assessment: Report given to RN and Post -op Vital signs reviewed and stable  Post vital signs: Reviewed and stable  Last Vitals:  Vitals Value Taken Time  BP 92/56 04/11/23 0923  Temp    Pulse 75 04/11/23 0923  Resp 16 04/11/23 0923  SpO2 100 % 04/11/23 0923  Vitals shown include unfiled device data.  Last Pain:  Vitals:   04/11/23 0807  TempSrc: Temporal  PainSc: 0-No pain         Complications: No notable events documented.

## 2023-04-11 NOTE — Anesthesia Preprocedure Evaluation (Signed)
Anesthesia Evaluation  Patient identified by MRN, date of birth, ID band Patient awake    Reviewed: Allergy & Precautions, NPO status , Patient's Chart, lab work & pertinent test results  History of Anesthesia Complications Negative for: history of anesthetic complications  Airway Mallampati: II  TM Distance: >3 FB Neck ROM: full    Dental  (+) Chipped   Pulmonary neg pulmonary ROS   Pulmonary exam normal        Cardiovascular negative cardio ROS Normal cardiovascular exam     Neuro/Psych negative neurological ROS  negative psych ROS   GI/Hepatic Neg liver ROS,GERD  Medicated,,  Endo/Other  negative endocrine ROS    Renal/GU negative Renal ROS  negative genitourinary   Musculoskeletal   Abdominal   Peds  Hematology negative hematology ROS (+)   Anesthesia Other Findings Past Medical History: No date: GERD (gastroesophageal reflux disease) No date: Infertility, female  Past Surgical History: No date: none     Reproductive/Obstetrics negative OB ROS                             Anesthesia Physical Anesthesia Plan  ASA: 2  Anesthesia Plan: General   Post-op Pain Management: Minimal or no pain anticipated   Induction: Intravenous  PONV Risk Score and Plan: 2 and Propofol infusion and TIVA  Airway Management Planned: Natural Airway and Nasal Cannula  Additional Equipment:   Intra-op Plan:   Post-operative Plan:   Informed Consent: I have reviewed the patients History and Physical, chart, labs and discussed the procedure including the risks, benefits and alternatives for the proposed anesthesia with the patient or authorized representative who has indicated his/her understanding and acceptance.     Dental Advisory Given  Plan Discussed with: Anesthesiologist, CRNA and Surgeon  Anesthesia Plan Comments: (Patient consented for risks of anesthesia including but not  limited to:  - adverse reactions to medications - risk of airway placement if required - damage to eyes, teeth, lips or other oral mucosa - nerve damage due to positioning  - sore throat or hoarseness - Damage to heart, brain, nerves, lungs, other parts of body or loss of life  Patient voiced understanding.)       Anesthesia Quick Evaluation

## 2023-04-11 NOTE — Anesthesia Postprocedure Evaluation (Signed)
Anesthesia Post Note  Patient: Vanessa Cox  Procedure(s) Performed: COLONOSCOPY WITH PROPOFOL  Patient location during evaluation: Endoscopy Anesthesia Type: General Level of consciousness: awake and alert Pain management: pain level controlled Vital Signs Assessment: post-procedure vital signs reviewed and stable Respiratory status: spontaneous breathing, nonlabored ventilation, respiratory function stable and patient connected to nasal cannula oxygen Cardiovascular status: blood pressure returned to baseline and stable Postop Assessment: no apparent nausea or vomiting Anesthetic complications: no   No notable events documented.   Last Vitals:  Vitals:   04/11/23 0807 04/11/23 0923  BP: 106/72 (!) 92/56  Pulse: 77 92  Resp: 18 13  Temp: (!) 36.2 C (!) 36.2 C  SpO2: 100% 100%    Last Pain:  Vitals:   04/11/23 0923  TempSrc: Temporal  PainSc: 0-No pain                 Louie Boston

## 2023-04-14 ENCOUNTER — Encounter: Payer: Self-pay | Admitting: Gastroenterology

## 2023-04-25 ENCOUNTER — Ambulatory Visit
Admission: RE | Admit: 2023-04-25 | Discharge: 2023-04-25 | Disposition: A | Discharge status: Home / Self Care | Payer: 59 | Source: Ambulatory Visit | Attending: Licensed Practical Nurse | Admitting: Licensed Practical Nurse

## 2023-04-25 DIAGNOSIS — Z1231 Encounter for screening mammogram for malignant neoplasm of breast: Secondary | ICD-10-CM | POA: Insufficient documentation

## 2024-04-14 NOTE — Progress Notes (Unsigned)
 Gynecology Annual Exam  PCP: Cleotilde Oneil FALCON, MD  Chief Complaint: No chief complaint on file.   History of Present Illness: Patient is a 48 y.o. Cox presents for annual exam. The patient has no complaints today.   LMP: No LMP recorded. Average Interval: {Desc; regular/irreg:14544}, {numbers 22-35:14824} days Duration of flow: {numbers; 0-10:33138} days Heavy Menses: {yes/no:63} Clots: {yes/no:63} Intermenstrual Bleeding: {yes/no:63} Postcoital Bleeding: {yes/no:63} Dysmenorrhea: {yes/no:63}   The patient {sys sexually active:13135} sexually active. She currently uses {method:5051} for contraception. She {has/denies:315300} dyspareunia.  The patient {DOES_DOES WNU:81435} perform self breast exams.  There {is/is no:19420} notable family history of breast or ovarian cancer in her family.  The patient wears seatbelts: {yes/no:63}.   The patient has regular exercise: {yes/no/not asked:9010}.    The patient {Blank single:19197::reports,denies} current symptoms of depression.    Review of Systems: ROS  Past Medical History:  Patient Active Problem List   Diagnosis Date Noted   Encounter for screening colonoscopy 04/11/2023   Microhematuria 05/03/2015   Abdominal pain 05/03/2015   Nocturia 05/03/2015    Past Surgical History:  Past Surgical History:  Procedure Laterality Date   COLONOSCOPY WITH PROPOFOL  N/A 04/11/2023   Procedure: COLONOSCOPY WITH PROPOFOL ;  Surgeon: Therisa Bi, MD;  [facility] END[facility];  Service: Gastroenterology;  Laterality: N/A;   none      Gynecologic History:  No LMP recorded. Contraception: {method:5051} Last Pap: Results were: *** {Findings; lab pap smear results:16707::NIL and HR HPV+,NIL and HR HPV negative}  Last mammogram: *** Results were: {Blank single:19197::***,BI-RADS IV,BI-RAD III,BI-RAD II,BI-RAD I}  Obstetric History: Cox  Family History:  Family History  Problem Relation Age of Onset   Urinary  tract infection Mother    Nephrolithiasis Brother    Bladder Cancer Neg Hx    Kidney cancer Neg Hx    Prostate cancer Neg Hx    Breast cancer Neg Hx     Social History:  Social History   Socioeconomic History   Marital status: Married    Spouse name: Not on file   Number of children: Not on file   Years of education: Not on file   Highest education level: Not on file  Occupational History   Not on file  Tobacco Use   Smoking status: Never   Smokeless tobacco: Never  Vaping Use   Vaping status: Never Used  Substance and Sexual Activity   Alcohol use: No    Alcohol/week: 0.0 standard drinks of alcohol   Drug use: No   Sexual activity: Yes    Birth control/protection: None  Other Topics Concern   Not on file  Social History Narrative   Not on file   Social Drivers of Health   Financial Resource Strain: Not on file  Food Insecurity: Not on file  Transportation Needs: Not on file  Physical Activity: Not on file  Stress: Not on file  Social Connections: Not on file  Intimate Partner Violence: Not on file    Allergies:  No Known Allergies  Medications: Prior to Admission medications   Not on File    Physical Exam Vitals: There were no vitals taken for this visit.  General: NAD HEENT: normocephalic, anicteric Thyroid: no enlargement, no palpable nodules Pulmonary: No increased work of breathing, CTAB Cardiovascular: RRR, distal pulses 2+ Breast: Breast symmetrical, no tenderness, no palpable nodules or masses, no skin or nipple retraction present, no nipple discharge.  No axillary or supraclavicular lymphadenopathy. Abdomen: NABS, soft, non-tender, non-distended.  Umbilicus without lesions.  No hepatomegaly, splenomegaly or masses palpable. No evidence of hernia  Genitourinary:  External: Normal external female genitalia.  Normal urethral meatus, normal Bartholin's and Skene's glands.    Vagina: Normal vaginal mucosa, no evidence of prolapse.    Cervix:  Grossly normal in appearance, no bleeding  Uterus: Non-enlarged, mobile, normal contour.  No CMT  Adnexa: ovaries non-enlarged, no adnexal masses  Rectal: deferred  Lymphatic: no evidence of inguinal lymphadenopathy Extremities: no edema, erythema, or tenderness Neurologic: Grossly intact Psychiatric: mood appropriate, affect full  Female chaperone present for pelvic and breast  portions of the physical exam    Assessment: 48 y.o. Cox routine annual exam  Plan: Problem List Items Addressed This Visit   None   1) Mammogram - recommend yearly screening mammogram.  Mammogram {Blank single:19197::Is up to date,Was ordered today}   2) STI screening  {Blank single:19197::was,was not}offered and {Blank single:19197::accepted,declined,therefore not obtained}  3) ASCCP guidelines and rational discussed.  Patient opts for {Blank single:19197::***,every 5 years,every 3 years,yearly,discontinue age >65,discontinue secondary to prior hysterectomy} screening interval  4) Contraception - the patient is currently using  {method:5051}.  She is {Blank single:19197::happy with her current form of contraception and plans to continue,interested in changing to ***,interested in starting Contraception: ***,not currently in need of contraception secondary to being sterile,attempting to conceive in the near future}  5) Colonoscopy -- Screening recommended starting at age 47 for average risk individuals, age 49 for individuals deemed at increased risk (including African Americans) and recommended to continue until age 2.  For patient age 67-85 individualized approach is recommended.  Gold standard screening is via colonoscopy, Cologuard screening is an acceptable alternative for patient unwilling or unable to undergo colonoscopy.  Colorectal cancer screening for average?risk adults: 2018 guideline update from the American Cancer SocietyCA: A Cancer Journal for  Clinicians: May Vanessa, 2018   6) Routine healthcare maintenance including cholesterol, diabetes screening discussed {Blank single:19197::managed by PCP,Ordered today,To return fasting at a later date,Declines}  7) No follow-ups on file.  Jinnie Cookey, CNM  [facility] 04/14/2024, 1:50 PM

## 2024-04-15 NOTE — Patient Instructions (Signed)
 How to Do a Breast Self-Exam Doing breast self-exams can help you stay healthy. They're one way to know what's normal for your breasts. They can help you catch a problem while it's still small and can be treated. You need to: Check your breasts often. Tell your doctor about any changes. You should do breast self-exams even if you have breast implants. What you need: A mirror. A well-lit room. A pillow or other soft object. How to do a breast self-exam Look for changes  Take off all the clothes above your waist. Stand in front of a mirror in a room with good lighting. Put your hands down at your sides. Compare your breasts in the mirror. Look for difference between them, such as: Differences in shape. Differences in size. Wrinkles, dips, and bumps in one breast and not the other. Look at each breast for skin changes, such as: Redness. Scaly spots. Spots where your skin is thicker. Dimpling. Open sores. Look for changes in your nipples, such as: Fluid coming out of a nipple. Fluid around a nipple. Bleeding. Dimpling. Redness. A nipple that looks pushed in or that has changed position. Feel for changes Lie on your back. Feel each breast. To do this: Pick a breast to feel. Place a pillow under the shoulder closest to that breast. Put the arm closest to that breast behind your head. Feel the breast using the hand of your other arm. Use the pads of your three middle fingers to make small circles starting near the nipple. Use light, medium, and firm pressure. Keep making circles, moving down over the breast. Stop when you feel your ribs. Start making circles with your fingers again, this time going up until you reach your collarbone. Then, make circles out across your breast and into your armpit area. Squeeze your nipple. Check for fluid and lumps. Do these steps again to check your other breast. Sit or stand in the tub or shower. With soapy water on your skin, feel each breast  the same way you did when you were lying down. Write down what you find Writing down what you find can help you keep track of what you want to tell your doctor. Write down: What's normal for each breast. Any changes you find. Write down: The kind of change. If your breast feels tender or painful. Any lump you find. Write down its size and where it is. When you last had your period. General tips If you're breastfeeding, the best time to check your breasts is after you feed your baby or after you use a breast pump. If you get a period, the best time to check your breasts is 5-7 days after your period ends. With time, you'll get more used to doing the self-exam. You'll also start to know if there are changes in your breasts. Contact a doctor if: You see a change in the shape or size of your breasts or nipples. You see a change in the skin of your breast or nipples. You have fluid coming from your nipples that isn't normal. You find a new lump or thick area. You have breast pain. You have any concerns about your breast health. This information is not intended to replace advice given to you by your health care provider. Make sure you discuss any questions you have with your health care provider. Document Revised: 09/17/2023 Document Reviewed: 09/17/2023 Elsevier Patient Education  2025 [employer].  Preventive Care 40-58 Years Old, Female Preventive care refers to lifestyle choices  and visits with your health care provider that can promote health and wellness. Preventive care visits are also called wellness exams. What can I expect for my preventive care visit? Counseling Your health care provider may ask you questions about your: Medical history, including: Past medical problems. Family medical history. Pregnancy history. Current health, including: Menstrual cycle. Method of birth control. Emotional well-being. Home life and relationship well-being. Sexual activity and sexual  health. Lifestyle, including: Alcohol, nicotine or tobacco, and drug use. Access to firearms. Diet, exercise, and sleep habits. Work and work Astronomer. Sunscreen use. Safety issues such as seatbelt and bike helmet use. Physical exam Your health care provider will check your: Height and weight. These may be used to calculate your BMI (body mass index). BMI is a measurement that tells if you are at a healthy weight. Waist circumference. This measures the distance around your waistline. This measurement also tells if you are at a healthy weight and may help predict your risk of certain diseases, such as type 2 diabetes and high blood pressure. Heart rate and blood pressure. Body temperature. Skin for abnormal spots. What immunizations do I need?  Vaccines are usually given at various ages, according to a schedule. Your health care provider will recommend vaccines for you based on your age, medical history, and lifestyle or other factors, such as travel or where you work. What tests do I need? Screening Your health care provider may recommend screening tests for certain conditions. This may include: Lipid and cholesterol levels. Diabetes screening. This is done by checking your blood sugar (glucose) after you have not eaten for a while (fasting). Pelvic exam and Pap test. Hepatitis B test. Hepatitis C test. HIV (human immunodeficiency virus) test. STI (sexually transmitted infection) testing, if you are at risk. Lung cancer screening. Colorectal cancer screening. Mammogram. Talk with your health care provider about when you should start having regular mammograms. This may depend on whether you have a family history of breast cancer. BRCA-related cancer screening. This may be done if you have a family history of breast, ovarian, tubal, or peritoneal cancers. Bone density scan. This is done to screen for osteoporosis. Talk with your health care provider about your test results,  treatment options, and if necessary, the need for more tests. Follow these instructions at home: Eating and drinking  Eat a diet that includes fresh fruits and vegetables, whole grains, lean protein, and low-fat dairy products. Take vitamin and mineral supplements as recommended by your health care provider. Do not drink alcohol if: Your health care provider tells you not to drink. You are pregnant, may be pregnant, or are planning to become pregnant. If you drink alcohol: Limit how much you have to 0-1 drink a day. Know how much alcohol is in your drink. In the [place] one drink equals one 12 oz bottle of beer (355 mL), one 5 oz glass of wine (148 mL), or one 1 oz glass of hard liquor (44 mL). Lifestyle Brush your teeth every morning and night with fluoride toothpaste. Floss one time each day. Exercise for at least 30 minutes 5 or more days each week. Do not use any products that contain nicotine or tobacco. These products include cigarettes, chewing tobacco, and vaping devices, such as e-cigarettes. If you need help quitting, ask your health care provider. Do not use drugs. If you are sexually active, practice safe sex. Use a condom or other form of protection to prevent STIs. If you do not wish to become pregnant, use  a form of birth control. If you plan to become pregnant, see your health care provider for a prepregnancy visit. Take aspirin only as told by your health care provider. Make sure that you understand how much to take and what form to take. Work with your health care provider to find out whether it is safe and beneficial for you to take aspirin daily. Find healthy ways to manage stress, such as: Meditation, yoga, or listening to music. Journaling. Talking to a trusted person. Spending time with friends and family. Minimize exposure to UV radiation to reduce your risk of skin cancer. Safety Always wear your seat belt while driving or riding in a vehicle. Do not drive: If you  have been drinking alcohol. Do not ride with someone who has been drinking. When you are tired or distracted. While texting. If you have been using any mind-altering substances or drugs. Wear a helmet and other protective equipment during sports activities. If you have firearms in your house, make sure you follow all gun safety procedures. Seek help if you have been physically or sexually abused. What's next? Visit your health care provider once a year for an annual wellness visit. Ask your health care provider how often you should have your eyes and teeth checked. Stay up to date on all vaccines. This information is not intended to replace advice given to you by your health care provider. Make sure you discuss any questions you have with your health care provider. Document Revised: 01/03/2021 Document Reviewed: 01/03/2021 Elsevier Patient Education  2024 [employer].

## 2024-04-16 ENCOUNTER — Other Ambulatory Visit (HOSPITAL_COMMUNITY)
Admission: RE | Admit: 2024-04-16 | Discharge: 2024-04-16 | Disposition: A | Source: Ambulatory Visit | Attending: Licensed Practical Nurse | Admitting: Licensed Practical Nurse

## 2024-04-16 ENCOUNTER — Ambulatory Visit (INDEPENDENT_AMBULATORY_CARE_PROVIDER_SITE_OTHER): Admitting: Licensed Practical Nurse

## 2024-04-16 ENCOUNTER — Encounter: Payer: Self-pay | Admitting: Licensed Practical Nurse

## 2024-04-16 VITALS — BP 102/67 | HR 65 | Ht 64.0 in | Wt 153.1 lb

## 2024-04-16 DIAGNOSIS — Z1231 Encounter for screening mammogram for malignant neoplasm of breast: Secondary | ICD-10-CM

## 2024-04-16 DIAGNOSIS — Z124 Encounter for screening for malignant neoplasm of cervix: Secondary | ICD-10-CM

## 2024-04-16 DIAGNOSIS — Z01419 Encounter for gynecological examination (general) (routine) without abnormal findings: Secondary | ICD-10-CM | POA: Insufficient documentation

## 2024-04-20 ENCOUNTER — Other Ambulatory Visit: Payer: Self-pay | Admitting: Licensed Practical Nurse

## 2024-04-20 DIAGNOSIS — Z1231 Encounter for screening mammogram for malignant neoplasm of breast: Secondary | ICD-10-CM

## 2024-04-20 LAB — CYTOLOGY - PAP
Comment: NEGATIVE
Diagnosis: NEGATIVE
High risk HPV: NEGATIVE

## 2024-05-12 ENCOUNTER — Encounter

## 2024-05-20 ENCOUNTER — Ambulatory Visit
Admission: RE | Admit: 2024-05-20 | Discharge: 2024-05-20 | Disposition: A | Discharge status: Home / Self Care | Source: Ambulatory Visit | Attending: Licensed Practical Nurse | Admitting: Licensed Practical Nurse

## 2024-05-20 DIAGNOSIS — Z1231 Encounter for screening mammogram for malignant neoplasm of breast: Secondary | ICD-10-CM | POA: Diagnosis present
